# Patient Record
Sex: Female | Born: 1945
Health system: Southern US, Community
[De-identification: ages and names within clinical notes are randomized; demographics above are authoritative.]

## PROBLEM LIST (undated history)

## (undated) DIAGNOSIS — R011 Cardiac murmur, unspecified: Secondary | ICD-10-CM

## (undated) DIAGNOSIS — M1990 Unspecified osteoarthritis, unspecified site: Secondary | ICD-10-CM

## (undated) DIAGNOSIS — D171 Benign lipomatous neoplasm of skin and subcutaneous tissue of trunk: Secondary | ICD-10-CM

## (undated) DIAGNOSIS — E785 Hyperlipidemia, unspecified: Secondary | ICD-10-CM

## (undated) DIAGNOSIS — F419 Anxiety disorder, unspecified: Secondary | ICD-10-CM

## (undated) DIAGNOSIS — I10 Essential (primary) hypertension: Secondary | ICD-10-CM

## (undated) DIAGNOSIS — J302 Other seasonal allergic rhinitis: Secondary | ICD-10-CM

## (undated) DIAGNOSIS — D649 Anemia, unspecified: Secondary | ICD-10-CM

## (undated) DIAGNOSIS — F32A Depression, unspecified: Secondary | ICD-10-CM

## (undated) DIAGNOSIS — E1159 Type 2 diabetes mellitus with other circulatory complications: Secondary | ICD-10-CM

## (undated) DIAGNOSIS — F329 Major depressive disorder, single episode, unspecified: Secondary | ICD-10-CM

## (undated) DIAGNOSIS — Z531 Procedure and treatment not carried out because of patient's decision for reasons of belief and group pressure: Secondary | ICD-10-CM

## (undated) DIAGNOSIS — K635 Polyp of colon: Secondary | ICD-10-CM

## (undated) DIAGNOSIS — E559 Vitamin D deficiency, unspecified: Secondary | ICD-10-CM

## (undated) DIAGNOSIS — E1165 Type 2 diabetes mellitus with hyperglycemia: Principal | ICD-10-CM

## (undated) DIAGNOSIS — E1169 Type 2 diabetes mellitus with other specified complication: Secondary | ICD-10-CM

## (undated) DIAGNOSIS — H9193 Unspecified hearing loss, bilateral: Secondary | ICD-10-CM

## (undated) DIAGNOSIS — H269 Unspecified cataract: Secondary | ICD-10-CM

## (undated) DIAGNOSIS — E119 Type 2 diabetes mellitus without complications: Secondary | ICD-10-CM

## (undated) HISTORY — DX: Unspecified cataract: H26.9

## (undated) HISTORY — DX: Cardiac murmur, unspecified: R01.1

## (undated) HISTORY — DX: Essential (primary) hypertension: I10

## (undated) HISTORY — DX: Unspecified osteoarthritis, unspecified site: M19.90

## (undated) HISTORY — DX: Type 2 diabetes mellitus without complications: E11.9

## (undated) HISTORY — DX: Vitamin D deficiency, unspecified: E55.9

## (undated) HISTORY — DX: Anxiety disorder, unspecified: F41.9

## (undated) HISTORY — DX: Other seasonal allergic rhinitis: J30.2

## (undated) HISTORY — PX: LIPOMA EXCISION: SHX5283

## (undated) HISTORY — DX: Hyperlipidemia, unspecified: E78.5

## (undated) HISTORY — DX: Polyp of colon: K63.5

## (undated) HISTORY — PX: POLYPECTOMY: SHX149

## (undated) HISTORY — DX: Depression, unspecified: F32.A

## (undated) HISTORY — DX: Benign lipomatous neoplasm of skin and subcutaneous tissue of trunk: D17.1

## (undated) HISTORY — DX: Unspecified hearing loss, bilateral: H91.93

## (undated) HISTORY — DX: Anemia, unspecified: D64.9

## (undated) HISTORY — DX: Type 2 diabetes mellitus with hyperglycemia: E11.65

## (undated) HISTORY — DX: Type 2 diabetes mellitus with other circulatory complications: E11.59

## (undated) HISTORY — DX: Type 2 diabetes mellitus with other specified complication: E11.69

## (undated) HISTORY — PX: BREAST BIOPSY: SHX20

## (undated) HISTORY — DX: Procedure and treatment not carried out because of patient's decision for reasons of belief and group pressure: Z53.1

## (undated) HISTORY — PX: COLONOSCOPY: SHX174

---
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
## 1994-03-08 HISTORY — PX: ABDOMINAL HYSTERECTOMY: SHX81

## 1997-06-28 ENCOUNTER — Other Ambulatory Visit: Admission: RE | Admit: 1997-06-28 | Discharge: 1997-06-28 | Payer: Self-pay | Admitting: *Deleted

## 1998-05-12 ENCOUNTER — Other Ambulatory Visit: Admission: RE | Admit: 1998-05-12 | Discharge: 1998-05-12 | Payer: Self-pay | Admitting: *Deleted

## 1999-09-16 ENCOUNTER — Encounter: Payer: Self-pay | Admitting: *Deleted

## 1999-09-16 ENCOUNTER — Encounter: Admission: RE | Admit: 1999-09-16 | Discharge: 1999-09-16 | Payer: Self-pay | Admitting: *Deleted

## 2000-09-21 ENCOUNTER — Encounter: Payer: Self-pay | Admitting: *Deleted

## 2000-09-21 ENCOUNTER — Encounter: Admission: RE | Admit: 2000-09-21 | Discharge: 2000-09-21 | Payer: Self-pay | Admitting: *Deleted

## 2001-10-10 ENCOUNTER — Encounter: Payer: Self-pay | Admitting: *Deleted

## 2001-10-10 ENCOUNTER — Encounter: Admission: RE | Admit: 2001-10-10 | Discharge: 2001-10-10 | Payer: Self-pay | Admitting: *Deleted

## 2002-10-12 ENCOUNTER — Encounter: Payer: Self-pay | Admitting: Internal Medicine

## 2002-10-12 ENCOUNTER — Encounter: Admission: RE | Admit: 2002-10-12 | Discharge: 2002-10-12 | Payer: Self-pay | Admitting: Internal Medicine

## 2003-10-25 ENCOUNTER — Encounter: Admission: RE | Admit: 2003-10-25 | Discharge: 2003-10-25 | Payer: Self-pay | Admitting: Obstetrics and Gynecology

## 2006-03-12 ENCOUNTER — Emergency Department (HOSPITAL_COMMUNITY): Admission: AD | Admit: 2006-03-12 | Discharge: 2006-03-12 | Payer: Self-pay | Admitting: Emergency Medicine

## 2006-04-15 ENCOUNTER — Emergency Department (HOSPITAL_COMMUNITY): Admission: EM | Admit: 2006-04-15 | Discharge: 2006-04-15 | Payer: Self-pay | Admitting: Family Medicine

## 2006-10-26 ENCOUNTER — Encounter: Admission: RE | Admit: 2006-10-26 | Discharge: 2006-10-26 | Payer: Self-pay | Admitting: Internal Medicine

## 2007-01-02 ENCOUNTER — Emergency Department (HOSPITAL_COMMUNITY): Admission: EM | Admit: 2007-01-02 | Discharge: 2007-01-02 | Payer: Self-pay | Admitting: Emergency Medicine

## 2008-01-27 ENCOUNTER — Emergency Department (HOSPITAL_COMMUNITY): Admission: EM | Admit: 2008-01-27 | Discharge: 2008-01-27 | Payer: Self-pay | Admitting: Emergency Medicine

## 2008-02-12 ENCOUNTER — Ambulatory Visit: Payer: Self-pay | Admitting: *Deleted

## 2008-02-13 ENCOUNTER — Encounter: Admission: RE | Admit: 2008-02-13 | Discharge: 2008-02-13 | Payer: Self-pay | Admitting: *Deleted

## 2008-02-13 ENCOUNTER — Ambulatory Visit: Payer: Self-pay | Admitting: *Deleted

## 2008-02-14 LAB — CONVERTED CEMR LAB
BUN: 11 mg/dL (ref 6–23)
Calcium: 8.8 mg/dL (ref 8.4–10.5)
Cholesterol: 229 mg/dL (ref 0–200)
Eosinophils Absolute: 0.2 10*3/uL (ref 0.0–0.7)
Eosinophils Relative: 3 % (ref 0.0–5.0)
GFR calc Af Amer: 109 mL/min
GFR calc non Af Amer: 90 mL/min
Glucose, Bld: 122 mg/dL — ABNORMAL HIGH (ref 70–99)
HCT: 35.6 % — ABNORMAL LOW (ref 36.0–46.0)
Hemoglobin: 12.1 g/dL (ref 12.0–15.0)
MCV: 85.9 fL (ref 78.0–100.0)
Monocytes Absolute: 0.7 10*3/uL (ref 0.1–1.0)
Monocytes Relative: 12.1 % — ABNORMAL HIGH (ref 3.0–12.0)
Neutro Abs: 3.2 10*3/uL (ref 1.4–7.7)
Platelets: 168 10*3/uL (ref 150–400)
Potassium: 3.9 meq/L (ref 3.5–5.1)
RDW: 13.1 % (ref 11.5–14.6)
Sodium: 141 meq/L (ref 135–145)
Total CHOL/HDL Ratio: 4.9
Triglycerides: 99 mg/dL (ref 0–149)

## 2009-01-18 ENCOUNTER — Emergency Department (HOSPITAL_COMMUNITY): Admission: EM | Admit: 2009-01-18 | Discharge: 2009-01-18 | Payer: Self-pay | Admitting: Family Medicine

## 2009-06-17 ENCOUNTER — Encounter (INDEPENDENT_AMBULATORY_CARE_PROVIDER_SITE_OTHER): Payer: Self-pay | Admitting: *Deleted

## 2009-07-16 ENCOUNTER — Telehealth: Payer: Self-pay | Admitting: Internal Medicine

## 2009-08-22 ENCOUNTER — Encounter: Admission: RE | Admit: 2009-08-22 | Discharge: 2009-08-22 | Payer: Self-pay | Admitting: Internal Medicine

## 2009-08-22 LAB — HM MAMMOGRAPHY

## 2009-08-27 ENCOUNTER — Ambulatory Visit: Payer: Self-pay | Admitting: Internal Medicine

## 2009-08-27 DIAGNOSIS — IMO0001 Reserved for inherently not codable concepts without codable children: Secondary | ICD-10-CM

## 2009-09-01 LAB — CONVERTED CEMR LAB
ALT: 13 units/L (ref 0–35)
AST: 20 units/L (ref 0–37)
Basophils Relative: 0.6 % (ref 0.0–3.0)
Chloride: 107 meq/L (ref 96–112)
Direct LDL: 162.9 mg/dL
Eosinophils Relative: 1.7 % (ref 0.0–5.0)
GFR calc non Af Amer: 68.76 mL/min (ref >60)
HCT: 36.3 % (ref 36.0–46.0)
HDL: 55.8 mg/dL (ref >39.00)
Hemoglobin: 12.3 g/dL (ref 12.0–15.0)
Lymphs Abs: 1.7 10*3/uL (ref 0.7–4.0)
MCV: 87.2 fL (ref 78.0–100.0)
Monocytes Absolute: 0.7 10*3/uL (ref 0.1–1.0)
Monocytes Relative: 10.5 % (ref 3.0–12.0)
Neutro Abs: 3.8 10*3/uL (ref 1.4–7.7)
Platelets: 179 10*3/uL (ref 150.0–400.0)
Potassium: 4.4 meq/L (ref 3.5–5.1)
RBC: 4.17 M/uL (ref 3.87–5.11)
Sodium: 142 meq/L (ref 135–145)
Total Bilirubin: 0.5 mg/dL (ref 0.3–1.2)
Total Protein: 7.6 g/dL (ref 6.0–8.3)
VLDL: 21.6 mg/dL (ref 0.0–40.0)
WBC: 6.3 10*3/uL (ref 4.5–10.5)

## 2009-09-12 ENCOUNTER — Encounter: Payer: Self-pay | Admitting: Internal Medicine

## 2009-12-08 ENCOUNTER — Ambulatory Visit: Payer: Self-pay | Admitting: Internal Medicine

## 2009-12-08 LAB — CONVERTED CEMR LAB
AST: 25 units/L (ref 0–37)
Alkaline Phosphatase: 75 units/L (ref 39–117)
Bilirubin, Direct: 0.1 mg/dL (ref 0.0–0.3)
Total Protein: 7.2 g/dL (ref 6.0–8.3)
VLDL: 22.6 mg/dL (ref 0.0–40.0)

## 2009-12-15 ENCOUNTER — Ambulatory Visit: Payer: Self-pay | Admitting: Internal Medicine

## 2010-01-26 ENCOUNTER — Emergency Department (HOSPITAL_COMMUNITY): Admission: EM | Admit: 2010-01-26 | Discharge: 2010-01-26 | Payer: Self-pay | Admitting: Family Medicine

## 2010-04-09 NOTE — Assessment & Plan Note (Signed)
Summary: FU LABS/SP/pt rescd//ccm   Vital Signs:  Patient profile:   65 year old female Menstrual status:  hysterectomy Weight:      239 pounds Temp:     98.6 degrees F oral BP sitting:   140 / 92  (left arm) Cuff size:   large  Vitals Entered By: Sid Falcon LPN (01/14/10 9:34 AM)  Vitals Entered By: Sid Falcon LPN (01-14-10 9:34 AM)  Primary Care Provider:  Birdie Sons MD   History of Present Illness:  Follow-Up Visit      This is a 65 year old woman who presents for Follow-up visit.  The patient denies chest pain and palpitations.  Since the last visit the patient notes no new problems or concerns.  The patient reports taking meds as prescribed.  When questioned about possible medication side effects, the patient notes none.    All other systems reviewed and were negative   Contraindications/Deferment of Procedures/Staging:    Test/Procedure: FLU VAX    Reason for deferment: patient declined     Test/Procedure: TD vaccine    Reason for deferment: declined   Current Problems (verified): 1)  Obesity  (ICD-278.00) 2)  Glucose Intolerance  (ICD-271.3) 3)  Well Adult  (ICD-V70.0) 4)  Lipoma  (ICD-214.9) 5)  Hyperlipidemia  (ICD-272.4) 6)  Hypertension  (ICD-401.9) 7)  Anemia-nos  (ICD-285.9)  Current Medications (verified): 1)  None  Allergies (verified): No Known Drug Allergies  Past History:  Past Medical History: Last updated: 02/12/2008 Anemia-NOS heart murmur - since childhood - worked up - benign per patient Hypertension Hyperlipidemia lipoma  Past Surgical History: Last updated: 02/12/2008 hysterectomy 1996  Family History: Last updated: 01-14-2010 mother deceased 34 mother DM-- mother MI at age 71 mother high cholesterol father--alcohol father deceased CHF 80 yo  Social History: Last updated: 02/12/2008 Occupation: Public librarian married no children by birth, but 5 step children  Risk Factors: Caffeine Use:  2 (02/12/2008) Exercise: yes (02/12/2008)  Risk Factors: Smoking Status: quit (08/27/2009) Passive Smoke Exposure: no (02/12/2008)  Family History: mother deceased 13 mother DM-- mother MI at age 68 mother high cholesterol father--alcohol father deceased CHF 57 yo  Physical Exam  General:  alert and well-developed.   Head:  normocephalic and atraumatic.   Eyes:  pupils equal and pupils round.   Ears:  R ear normal and L ear normal.   Neck:  No deformities, masses, or tenderness noted. Lungs:  normal respiratory effort and no intercostal retractions.   Heart:  normal rate and regular rhythm.   Abdomen:  soft and non-tender.   Skin:  turgor normal and color normal.     Impression & Recommendations:  Problem # 1:  HYPERLIPIDEMIA (ICD-272.4)  note HDL will not reat no treatment necessary Labs Reviewed: SGOT: 25 (12/08/2009)   SGPT: 16 (12/08/2009)   HDL:57.50 (12/08/2009), 55.80 (08/27/2009)  LDL:DEL (02/13/2008)  Chol:239 (12/08/2009), 226 (08/27/2009)  Trig:113.0 (12/08/2009), 108.0 (08/27/2009)  Problem # 2:  HYPERTENSION (ICD-401.9) not controlled but not high enough to treat BP today: 140/92 Prior BP: 124/78 (08/27/2009)  Labs Reviewed: K+: 4.4 (08/27/2009) Creat: : 1.0 (08/27/2009)   Chol: 239 (12/08/2009)   HDL: 57.50 (12/08/2009)   LDL: DEL (02/13/2008)   TG: 113.0 (12/08/2009)  Problem # 3:  ANEMIA-NOS (ICD-285.9) resolved no further eval necessary Hgb: 12.3 (08/27/2009)   Hct: 36.3 (08/27/2009)   Platelets: 179.0 (08/27/2009) RBC: 4.17 (08/27/2009)   RDW: 14.4 (08/27/2009)   WBC: 6.3 (08/27/2009) MCV: 87.2 (08/27/2009)  MCHC: 33.8 (08/27/2009) TSH: 1.05 (08/27/2009)  Patient Instructions: 1)  Please schedule a follow-up appointment in 6 months. 2)  lipids 272.4 3)  liver 995.2  Prevention & Chronic Care Immunizations   Influenza vaccine: Not documented   Influenza vaccine deferral: patient declined  (12/15/2009)    Tetanus booster: Not  documented   Td booster deferral: declined  (12/15/2009)    Pneumococcal vaccine: Not documented    H. zoster vaccine: Not documented  Colorectal Screening   Hemoccult: Not documented    Colonoscopy: Normal  (09/04/2004)   Colonoscopy due: 09/2011  Other Screening   Pap smear: Normal  (09/04/2004)   Pap smear action/deferral: not indicated  (08/27/2009)    Mammogram: ASSESSMENT: Negative - BI-RADS 1^MM DIGITAL SCREENING  (08/22/2009)   Mammogram due: 09/2011    DXA bone density scan: Not documented   Smoking status: quit  (08/27/2009)  Lipids   Total Cholesterol: 239  (12/08/2009)   LDL: DEL  (02/13/2008)   LDL Direct: 174.7  (12/08/2009)   HDL: 57.50  (12/08/2009)   Triglycerides: 113.0  (12/08/2009)    SGOT (AST): 25  (12/08/2009)   SGPT (ALT): 16  (12/08/2009)   Alkaline phosphatase: 75  (12/08/2009)   Total bilirubin: 0.5  (12/08/2009)  Hypertension   Last Blood Pressure: 140 / 92  (12/15/2009)   Serum creatinine: 1.0  (08/27/2009)   Serum potassium 4.4  (08/27/2009)  Self-Management Support :    Hypertension self-management support: Not documented    Lipid self-management support: Not documented

## 2010-04-09 NOTE — Letter (Signed)
Summary: Colonoscopy Date Change Letter  [facility]  [address] [place], [place] [address]   Phone: [phone]  Fax: [phone]      June 17, 2009 MRN: [id number]   [facility] [place] [address] [place], [place]  [address]   Dear Ms. Sura,   Previously you were recommended to have a repeat colonoscopy around this time. Your chart was recently reviewed by Dr. Claudette Head of [facility]. Follow up colonoscopy is now recommended in April 2014. This revised recommendation is based on current, nationally recognized guidelines for colorectal cancer screening and polyp surveillance. These guidelines are endorsed by the American Cancer Society, [employer] on Colorectal Cancer as well as numerous other major medical organizations.  Please understand that our recommendation assumes that you do not have any new symptoms such as bleeding, a change in bowel habits, anemia, or significant abdominal discomfort. If you do have any concerning GI symptoms or want to discuss the guideline recommendations, please call to arrange an office visit at your earliest convenience. Otherwise we will keep you in our reminder system and contact you 1-2 months prior to the date listed above to schedule your next colonoscopy.  Thank you,  Judie Petit T. Russella Dar, M.D.  [facility] [phone]

## 2010-04-09 NOTE — Assessment & Plan Note (Signed)
Summary: PT RE-EST // RS pt rsc/njr rsc bmp/njr   Vital Signs:  Patient profile:   65 year old female Menstrual status:  hysterectomy Height:      68 inches Weight:      237 pounds BMI:     36.17 Pulse rate:   72 / minute Pulse rhythm:   regular Resp:     14 per minute BP sitting:   124 / 78  (left arm) Cuff size:   regular  Vitals Entered By: Gladis Riffle, RN (August 27, 2009 8:21 AM) CC: to re-establish--c/o fatty lump at upper left back (has been removed and reappeared) Is Patient Diabetic? No LMP - Character: hysterectomy Menarche (age onset years): 15   Menses interval (days): 28 Menstrual flow (days): 4 Menstrual Status hysterectomy Last PAP Result Normal   Primary Care Provider:  Paulo Fruit MD  CC:  to re-establish--c/o fatty lump at upper left back (has been removed and reappeared).  History of Present Illness: new patient (she states I have seen her 10 years ago)  Here to reestablish (has health insurance) she has a hx of glucose intolerance---not checked in years. she has no overt sxs of DM  she has a lipoma---removed 15 years ago. has recurred---she is concerned with growing.   HTN---no medications  All other systems reviewed and were negative   Preventive Screening-Counseling & Management  Alcohol-Tobacco     Smoking Status: quit     Year Started: 1971     Year Quit: 1976  Current Problems (verified): 1)  Glucose Intolerance  (ICD-271.3) 2)  Well Adult  (ICD-V70.0) 3)  Lipoma  (ICD-214.9) 4)  Hyperlipidemia  (ICD-272.4) 5)  Hypertension  (ICD-401.9) 6)  Anemia-nos  (ICD-285.9)  Current Medications (verified): 1)  None  Allergies (verified): No Known Drug Allergies  Contraindications/Deferment of Procedures/Staging:    Test/Procedure: PAP Smear    Reason for deferment: not indicated   Family History: Family History of Alcoholism/Addiction--father Family History Diabetes 1st degree relative---father and mother Family History High  cholesterol-father and mother Family History Kidney disease--mother  Physical Exam  General:  alert and well-developed.   Head:  normocephalic and atraumatic.   Eyes:  pupils equal and pupils round.   Ears:  R ear normal and L ear normal.   Neck:  No deformities, masses, or tenderness noted. Lungs:  normal respiratory effort and no intercostal retractions.   Heart:  normal rate and regular rhythm.   Abdomen:  Bowel sounds positive,abdomen soft and non-tender without masses, organomegaly or hernias noted. Msk:  No deformity or scoliosis noted of thoracic or lumbar spine.   Neurologic:  cranial nerves II-XII intact and gait normal.     Impression & Recommendations:  Problem # 1:  GLUCOSE INTOLERANCE (ICD-271.3) likely related to weight advised aggressive weight loss  Problem # 2:  HYPERTENSION (ICD-401.9)  controlled and on no meds BP today: 124/78 Prior BP: 140/82 (02/12/2008)  Labs Reviewed: K+: 3.9 (02/13/2008) Creat: : 0.7 (02/13/2008)   Chol: 229 (02/13/2008)   HDL: 46.9 (02/13/2008)   LDL: DEL (02/13/2008)   TG: 99 (02/13/2008)  Orders: TLB-BMP (Basic Metabolic Panel-BMET) (80048-METABOL)  Problem # 3:  ANEMIA-NOS (ICD-285.9)  recheck today Hgb: 12.1 (02/13/2008)   Hct: 35.6 (02/13/2008)   Platelets: 168 (02/13/2008) RBC: 4.14 (02/13/2008)   RDW: 13.1 (02/13/2008)   WBC: 5.7 (02/13/2008) MCV: 85.9 (02/13/2008)   MCHC: 34.1 (02/13/2008) TSH: 1.29 (02/13/2008)  Orders: TLB-CBC Platelet - w/Differential (85025-CBCD)  Problem # 4:  OBESITY (ICD-278.00)  diet ,  exercise  Orders: TLB-TSH (Thyroid Stimulating Hormone) (84443-TSH)  Problem # 5:  LIPOMA (ICD-214.9)  i have advised no treatment will refer to surgeon for opinion  Orders: Surgical Referral (Surgery)  Other Orders: TLB-Lipid Panel (80061-LIPID) TLB-Hepatic/Liver Function Pnl (80076-HEPATIC)   Preventive Care Screening  Mammogram:    Next Due:  09/2011  Colonoscopy:    Next Due:   09/2011

## 2010-04-09 NOTE — Progress Notes (Signed)
Summary: REQ FOR APPT.  Phone Note Call from Patient   Caller: Patient   [phone] Reason for Call: Talk to Nurse, Talk to Doctor Summary of Call: Pt called to schedule an appt with Dr Cato Mulligan..... Unknown when pt was last seen / last appt - notation in EMR states Dr Cato Mulligan signed off on a Mammogram report for this pt on 11/04/2006 but pt was seen by Dr Johann Capers after that at [facility] (Dr Andrey Campanile has since moved)..... Pt wants to come to [facility] to become a pt of Dr Cato Mulligan again.... Can you advise?    Please let me know and I will contact pt at [phone] to schedule an appt..... Thanks!  Initial call taken by: Debbra Riding,  Jul 16, 2009 3:29 PM  Follow-up for Phone Call        would be new patient i'll see or can see drk/burchette Follow-up by: Birdie Sons MD,  Jul 17, 2009 4:53 PM  Additional Follow-up for Phone Call Additional follow up Details #1::        Phone Call Completed-----Contacted pt and she adv that since she used to see Dr Cato Mulligan in the past, she would like to schedule a pt re-est / new pt appt with Dr Cato Mulligan.... Pt made appt for Thursday, Jul 31, 2009 at 9am... Pt will come in fasting in case Dr Cato Mulligan wants to order any labwork.  Additional Follow-up by: Debbra Riding,  Jul 18, 2009 8:38 AM

## 2010-04-09 NOTE — Letter (Signed)
Summary: [facility]   Imported By: Maryln Gottron 10/22/2009 13:23:22  _____________________________________________________________________  External Attachment:    Type:   Image     Comment:   External Document

## 2010-06-08 ENCOUNTER — Other Ambulatory Visit (INDEPENDENT_AMBULATORY_CARE_PROVIDER_SITE_OTHER): Payer: Self-pay | Admitting: Internal Medicine

## 2010-06-08 DIAGNOSIS — T887XXA Unspecified adverse effect of drug or medicament, initial encounter: Secondary | ICD-10-CM

## 2010-06-08 DIAGNOSIS — E785 Hyperlipidemia, unspecified: Secondary | ICD-10-CM

## 2010-06-08 LAB — LIPID PANEL
HDL: 55.3 mg/dL (ref >39.00)
Total CHOL/HDL Ratio: 4
Triglycerides: 88 mg/dL (ref 0.0–149.0)

## 2010-06-08 LAB — LDL CHOLESTEROL, DIRECT: Direct LDL: 175.7 mg/dL

## 2010-06-08 LAB — HEPATIC FUNCTION PANEL
AST: 22 U/L (ref 0–37)
Bilirubin, Direct: 0.1 mg/dL (ref 0.0–0.3)
Total Bilirubin: 0.3 mg/dL (ref 0.3–1.2)

## 2010-06-11 ENCOUNTER — Encounter: Payer: Self-pay | Admitting: Internal Medicine

## 2010-06-15 ENCOUNTER — Ambulatory Visit: Payer: Self-pay | Admitting: Internal Medicine

## 2010-06-15 ENCOUNTER — Encounter: Payer: Self-pay | Admitting: Internal Medicine

## 2010-06-15 ENCOUNTER — Ambulatory Visit (INDEPENDENT_AMBULATORY_CARE_PROVIDER_SITE_OTHER): Payer: BC Managed Care – PPO | Admitting: Internal Medicine

## 2010-06-15 DIAGNOSIS — R7309 Other abnormal glucose: Secondary | ICD-10-CM

## 2010-06-15 DIAGNOSIS — E739 Lactose intolerance, unspecified: Secondary | ICD-10-CM

## 2010-06-15 DIAGNOSIS — E669 Obesity, unspecified: Secondary | ICD-10-CM

## 2010-06-15 DIAGNOSIS — I10 Essential (primary) hypertension: Secondary | ICD-10-CM

## 2010-06-15 DIAGNOSIS — E785 Hyperlipidemia, unspecified: Secondary | ICD-10-CM

## 2010-06-15 DIAGNOSIS — R739 Hyperglycemia, unspecified: Secondary | ICD-10-CM

## 2010-06-15 NOTE — Assessment & Plan Note (Signed)
Probably needs treatment Check labs today

## 2010-06-15 NOTE — Assessment & Plan Note (Signed)
Never treated She needs to take a medication---she refuses She will try diet and exercise See me 3 months

## 2010-06-15 NOTE — Progress Notes (Signed)
  Subjective:    Patient ID: Shelly Yoder, female    DOB: 29-Aug-1945, 65 y.o.   MRN: [id number]  HPI  Htn: no home BPs  Lipids---needs f/u  Glucose: needs f/u  Past Medical History  Diagnosis Date  . Anemia   . Heart murmur   . Hyperlipidemia   . Hypertension   . Lipoma    Past Surgical History  Procedure Date  . Abdominal hysterectomy 1996    reports that she quit smoking about 35 years ago. She does not have any smokeless tobacco history on file. Her alcohol and drug histories not on file. family history includes Alcohol abuse in her father; Diabetes in her mother; Heart attack in her mother; and Hyperlipidemia in her mother. No Known Allergies   Review of Systems  patient denies chest pain, shortness of breath, orthopnea. Denies lower extremity edema, abdominal pain, change in appetite, change in bowel movements. Patient denies rashes, musculoskeletal complaints. No other specific complaints in a complete review of systems.      Objective:   Physical Exam  Well-developed well-nourished female in no acute distress. HEENT exam atraumatic, normocephalic, extraocular muscles are intact. Neck is supple. No jugular venous distention no thyromegaly. Chest clear to auscultation without increased work of breathing. Cardiac exam S1 and S2 are regular. Abdominal exam active bowel sounds, soft, nontender. Extremities no edema. Neurologic exam she is alert without any motor sensory deficits. Gait is normal.        Assessment & Plan:

## 2010-06-15 NOTE — Assessment & Plan Note (Signed)
Note weight She needs f/u labs Check today

## 2010-06-15 NOTE — Assessment & Plan Note (Signed)
She needs to lose weight Discussed need for diet and exercise

## 2010-09-14 ENCOUNTER — Other Ambulatory Visit: Payer: BC Managed Care – PPO

## 2010-09-21 ENCOUNTER — Ambulatory Visit: Payer: BC Managed Care – PPO | Admitting: Internal Medicine

## 2012-03-08 DIAGNOSIS — E119 Type 2 diabetes mellitus without complications: Secondary | ICD-10-CM

## 2012-03-08 HISTORY — DX: Type 2 diabetes mellitus without complications: E11.9

## 2012-06-14 ENCOUNTER — Encounter: Payer: Self-pay | Admitting: Gastroenterology

## 2012-11-22 ENCOUNTER — Other Ambulatory Visit: Payer: Self-pay

## 2012-11-22 DIAGNOSIS — Z1231 Encounter for screening mammogram for malignant neoplasm of breast: Secondary | ICD-10-CM

## 2012-12-18 ENCOUNTER — Ambulatory Visit
Admission: RE | Admit: 2012-12-18 | Discharge: 2012-12-18 | Disposition: A | Discharge status: Home / Self Care | Payer: Medicare Other | Source: Ambulatory Visit

## 2012-12-18 DIAGNOSIS — Z1231 Encounter for screening mammogram for malignant neoplasm of breast: Secondary | ICD-10-CM

## 2013-02-23 ENCOUNTER — Other Ambulatory Visit (INDEPENDENT_AMBULATORY_CARE_PROVIDER_SITE_OTHER): Payer: Medicare Other

## 2013-02-23 DIAGNOSIS — E785 Hyperlipidemia, unspecified: Secondary | ICD-10-CM

## 2013-02-23 DIAGNOSIS — I10 Essential (primary) hypertension: Secondary | ICD-10-CM

## 2013-02-23 DIAGNOSIS — Z0000 Encounter for general adult medical examination without abnormal findings: Secondary | ICD-10-CM

## 2013-02-23 LAB — CBC WITH DIFFERENTIAL/PLATELET
Basophils Relative: 0.7 % (ref 0.0–3.0)
Eosinophils Relative: 2.2 % (ref 0.0–5.0)
HCT: 36 % (ref 36.0–46.0)
Hemoglobin: 12 g/dL (ref 12.0–15.0)
Lymphs Abs: 1.7 10*3/uL (ref 0.7–4.0)
Monocytes Relative: 9.9 % (ref 3.0–12.0)
Neutro Abs: 3.4 10*3/uL (ref 1.4–7.7)
WBC: 5.9 10*3/uL (ref 4.5–10.5)

## 2013-02-23 LAB — POCT URINALYSIS DIPSTICK
Blood, UA: NEGATIVE
Glucose, UA: NEGATIVE
Nitrite, UA: NEGATIVE
Urobilinogen, UA: 0.2

## 2013-02-23 LAB — LIPID PANEL
Cholesterol: 263 mg/dL — ABNORMAL HIGH (ref 0–200)
Total CHOL/HDL Ratio: 5
VLDL: 21.8 mg/dL (ref 0.0–40.0)

## 2013-02-23 LAB — BASIC METABOLIC PANEL
GFR: 119.1 mL/min (ref >60.00)
Potassium: 3.9 mEq/L (ref 3.5–5.1)
Sodium: 140 mEq/L (ref 135–145)

## 2013-02-23 LAB — LDL CHOLESTEROL, DIRECT: Direct LDL: 199.2 mg/dL

## 2013-02-23 LAB — HEPATIC FUNCTION PANEL
ALT: 20 U/L (ref 0–35)
Total Bilirubin: 0.5 mg/dL (ref 0.3–1.2)

## 2013-03-02 ENCOUNTER — Ambulatory Visit (INDEPENDENT_AMBULATORY_CARE_PROVIDER_SITE_OTHER): Payer: Medicare Other | Admitting: Internal Medicine

## 2013-03-02 ENCOUNTER — Encounter: Payer: Self-pay | Admitting: Internal Medicine

## 2013-03-02 VITALS — BP 144/80 | HR 84 | Temp 98.0°F | Ht 68.75 in | Wt 239.0 lb

## 2013-03-02 DIAGNOSIS — E669 Obesity, unspecified: Secondary | ICD-10-CM

## 2013-03-02 DIAGNOSIS — R739 Hyperglycemia, unspecified: Secondary | ICD-10-CM

## 2013-03-02 DIAGNOSIS — Z0000 Encounter for general adult medical examination without abnormal findings: Secondary | ICD-10-CM

## 2013-03-02 DIAGNOSIS — R7309 Other abnormal glucose: Secondary | ICD-10-CM

## 2013-03-02 DIAGNOSIS — E785 Hyperlipidemia, unspecified: Secondary | ICD-10-CM

## 2013-03-02 DIAGNOSIS — I10 Essential (primary) hypertension: Secondary | ICD-10-CM

## 2013-03-02 NOTE — Progress Notes (Signed)
Pre visit review using our clinic review tool, if applicable. No additional management support is needed unless otherwise documented below in the visit note. 

## 2013-03-02 NOTE — Progress Notes (Signed)
cpx  Past Medical History  Diagnosis Date  . Anemia   . Heart murmur   . Hyperlipidemia   . Hypertension   . Lipoma     History   Social History  . Marital Status: Married    Spouse Name: N/A    Number of Children: N/A  . Years of Education: N/A   Occupational History  . Not on file.   Social History Main Topics  . Smoking status: Former Smoker    Quit date: 06/15/1975  . Smokeless tobacco: Not on file  . Alcohol Use:   . Drug Use:   . Sexual Activity:    Other Topics Concern  . Not on file   Social History Narrative  . No narrative on file    Past Surgical History  Procedure Laterality Date  . Abdominal hysterectomy  1996    Family History  Problem Relation Age of Onset  . Diabetes Mother   . Heart attack Mother   . Hyperlipidemia Mother   . Alcohol abuse Father     No Known Allergies  No current outpatient prescriptions on file prior to visit.   No current facility-administered medications on file prior to visit.     patient denies chest pain, shortness of breath, orthopnea. Denies lower extremity edema, abdominal pain, change in appetite, change in bowel movements. Patient denies rashes, musculoskeletal complaints. No other specific complaints in a complete review of systems.   BP 144/80  Pulse 84  Temp(Src) 98 F (36.7 C) (Oral)  Ht 5' 8.75" (1.746 m)  Wt 239 lb (108.41 kg)  BMI 35.56 kg/m2  Well-developed well-nourished female in no acute distress. HEENT exam atraumatic, normocephalic, extraocular muscles are intact. Neck is supple. No jugular venous distention no thyromegaly. Chest clear to auscultation without increased work of breathing. Cardiac exam S1 and S2 are regular. Abdominal exam active bowel sounds, soft, nontender. Extremities no edema. Neurologic exam she is alert without any motor sensory deficits. Gait is normal.  Well visit- health miant utd  Note lipids She needs to concentrate on weight loss.

## 2013-04-30 ENCOUNTER — Ambulatory Visit: Payer: Medicare Other | Admitting: Dietician

## 2013-05-28 ENCOUNTER — Other Ambulatory Visit: Payer: Medicare Other

## 2013-11-30 ENCOUNTER — Other Ambulatory Visit: Payer: Self-pay

## 2013-11-30 DIAGNOSIS — Z1231 Encounter for screening mammogram for malignant neoplasm of breast: Secondary | ICD-10-CM

## 2013-12-24 ENCOUNTER — Ambulatory Visit
Admission: RE | Admit: 2013-12-24 | Discharge: 2013-12-24 | Disposition: A | Discharge status: Home / Self Care | Payer: Medicare Other | Source: Ambulatory Visit

## 2013-12-24 DIAGNOSIS — Z1231 Encounter for screening mammogram for malignant neoplasm of breast: Secondary | ICD-10-CM

## 2013-12-31 ENCOUNTER — Ambulatory Visit (INDEPENDENT_AMBULATORY_CARE_PROVIDER_SITE_OTHER): Payer: Medicare Other | Admitting: Family

## 2013-12-31 ENCOUNTER — Encounter: Payer: Self-pay | Admitting: Family

## 2013-12-31 VITALS — BP 140/80 | HR 80 | Ht 68.75 in | Wt 249.7 lb

## 2013-12-31 DIAGNOSIS — I10 Essential (primary) hypertension: Secondary | ICD-10-CM

## 2013-12-31 DIAGNOSIS — R739 Hyperglycemia, unspecified: Secondary | ICD-10-CM

## 2013-12-31 DIAGNOSIS — E785 Hyperlipidemia, unspecified: Secondary | ICD-10-CM

## 2013-12-31 NOTE — Progress Notes (Signed)
Pre visit review using our clinic review tool, if applicable. No additional management support is needed unless otherwise documented below in the visit note. 

## 2013-12-31 NOTE — Patient Instructions (Signed)
Cardiac Diet This diet can help prevent heart disease and stroke. Many factors influence your heart health, including eating and exercise habits. Coronary risk rises a lot with abnormal blood fat (lipid) levels. Cardiac meal planning includes limiting unhealthy fats, increasing healthy fats, and making other small dietary changes. General guidelines are as follows:  Adjust calorie intake to reach and maintain desirable body weight.  Limit total fat intake to less than 30% of total calories. Saturated fat should be less than 7% of calories.  Saturated fats are found in animal products and in some vegetable products. Saturated vegetable fats are found in coconut oil, cocoa butter, palm oil, and palm kernel oil. Read labels carefully to avoid these products as much as possible. Use butter in moderation. Choose tub margarines and oils that have 2 grams of fat or less. Good cooking oils are canola and olive oils.  Practice low-fat cooking techniques. Do not fry food. Instead, broil, bake, boil, steam, grill, roast on a rack, stir-fry, or microwave it. Other fat reducing suggestions include:  Remove the skin from poultry.  Remove all visible fat from meats.  Skim the fat off stews, soups, and gravies before serving them.  Steam vegetables in water or broth instead of sauting them in fat.  Avoid foods with trans fat (or hydrogenated oils), such as commercially fried foods and commercially baked goods. Commercial shortening and deep-frying fats will contain trans fat.  Increase intake of fruits, vegetables, whole grains, and legumes to replace foods high in fat.  Increase consumption of nuts, legumes, and seeds to at least 4 servings weekly. One serving of a legume equals  cup, and 1 serving of nuts or seeds equals  cup.  Choose whole grains more often. Have 3 servings per day (a serving is 1 ounce [oz]).  Eat 4 to 5 servings of vegetables per day. A serving of vegetables is 1 cup of raw leafy  vegetables;  cup of raw or cooked cut-up vegetables;  cup of vegetable juice.  Eat 4 to 5 servings of fruit per day. A serving of fruit is 1 medium whole fruit;  cup of dried fruit;  cup of fresh, frozen, or canned fruit;  cup of 100% fruit juice.  Increase your intake of dietary fiber to 20 to 30 grams per day. Insoluble fiber may help lower your risk of heart disease and may help curb your appetite.  Soluble fiber binds cholesterol to be removed from the blood. Foods high in soluble fiber are dried beans, citrus fruits, oats, apples, bananas, broccoli, Brussels sprouts, and eggplant.  Try to include foods fortified with plant sterols or stanols, such as yogurt, breads, juices, or margarines. Choose several fortified foods to achieve a daily intake of 2 to 3 grams of plant sterols or stanols.  Foods with omega-3 fats can help reduce your risk of heart disease. Aim to have a 3.5 oz portion of fatty fish twice per week, such as salmon, mackerel, albacore tuna, sardines, lake trout, or herring. If you wish to take a fish oil supplement, choose one that contains 1 gram of both DHA and EPA.  Limit processed meats to 2 servings (3 oz portion) weekly.  Limit the sodium in your diet to 1500 milligrams (mg) per day. If you have high blood pressure, talk to a registered dietitian about a DASH (Dietary Approaches to Stop Hypertension) eating plan.  Limit sweets and beverages with added sugar, such as soda, to no more than 5 servings per week. One   serving is:   1 tablespoon sugar.  1 tablespoon jelly or jam.   cup sorbet.  1 cup lemonade.   cup regular soda. CHOOSING FOODS Starches  Allowed: Breads: All kinds (wheat, rye, raisin, white, oatmeal, Italian, French, and English muffin bread). Low-fat rolls: English muffins, frankfurter and hamburger buns, bagels, pita bread, tortillas (not fried). Pancakes, waffles, biscuits, and muffins made with recommended oil.  Avoid: Products made with  saturated or trans fats, oils, or whole milk products. Butter rolls, cheese breads, croissants. Commercial doughnuts, muffins, sweet rolls, biscuits, waffles, pancakes, store-bought mixes. Crackers  Allowed: Low-fat crackers and snacks: Animal, graham, rye, saltine (with recommended oil, no lard), oyster, and matzo crackers. Bread sticks, melba toast, rusks, flatbread, pretzels, and light popcorn.  Avoid: High-fat crackers: cheese crackers, butter crackers, and those made with coconut, palm oil, or trans fat (hydrogenated oils). Buttered popcorn. Cereals  Allowed: Hot or cold whole-grain cereals.  Avoid: Cereals containing coconut, hydrogenated vegetable fat, or animal fat. Potatoes / Pasta / Rice  Allowed: All kinds of potatoes, rice, and pasta (such as macaroni, spaghetti, and noodles).  Avoid: Pasta or rice prepared with cream sauce or high-fat cheese. Chow mein noodles, French fries. Vegetables  Allowed: All vegetables and vegetable juices.  Avoid: Fried vegetables. Vegetables in cream, butter, or high-fat cheese sauces. Limit coconut. Fruit in cream or custard. Protein  Allowed: Limit your intake of meat, seafood, and poultry to no more than 6 oz (cooked weight) per day. All lean, well-trimmed beef, veal, pork, and lamb. All chicken and turkey without skin. All fish and shellfish. Wild game: wild duck, rabbit, pheasant, and venison. Egg whites or low-cholesterol egg substitutes may be used as desired. Meatless dishes: recipes with dried beans, peas, lentils, and tofu (soybean curd). Seeds and nuts: all seeds and most nuts.  Avoid: Prime grade and other heavily marbled and fatty meats, such as short ribs, spare ribs, rib eye roast or steak, frankfurters, sausage, bacon, and high-fat luncheon meats, mutton. Caviar. Commercially fried fish. Domestic duck, goose, venison sausage. Organ meats: liver, gizzard, heart, chitterlings, brains, kidney, sweetbreads. Dairy  Allowed: Low-fat  cheeses: nonfat or low-fat cottage cheese (1% or 2% fat), cheeses made with part skim milk, such as mozzarella, farmers, string, or ricotta. (Cheeses should be labeled no more than 2 to 6 grams fat per oz.). Skim (or 1%) milk: liquid, powdered, or evaporated. Buttermilk made with low-fat milk. Drinks made with skim or low-fat milk or cocoa. Chocolate milk or cocoa made with skim or low-fat (1%) milk. Nonfat or low-fat yogurt.  Avoid: Whole milk cheeses, including colby, cheddar, muenster, Monterey Jack, Havarti, Brie, Camembert, American, Swiss, and blue. Creamed cottage cheese, cream cheese. Whole milk and whole milk products, including buttermilk or yogurt made from whole milk, drinks made from whole milk. Condensed milk, evaporated whole milk, and 2% milk. Soups and Combination Foods  Allowed: Low-fat low-sodium soups: broth, dehydrated soups, homemade broth, soups with the fat removed, homemade cream soups made with skim or low-fat milk. Low-fat spaghetti, lasagna, chili, and Spanish rice if low-fat ingredients and low-fat cooking techniques are used.  Avoid: Cream soups made with whole milk, cream, or high-fat cheese. All other soups. Desserts and Sweets  Allowed: Sherbet, fruit ices, gelatins, meringues, and angel food cake. Homemade desserts with recommended fats, oils, and milk products. Jam, jelly, honey, marmalade, sugars, and syrups. Pure sugar candy, such as gum drops, hard candy, jelly beans, marshmallows, mints, and small amounts of dark chocolate.  Avoid: Commercially prepared   cakes, pies, cookies, frosting, pudding, or mixes for these products. Desserts containing whole milk products, chocolate, coconut, lard, palm oil, or palm kernel oil. Ice cream or ice cream drinks. Candy that contains chocolate, coconut, butter, hydrogenated fat, or unknown ingredients. Buttered syrups. Fats and Oils  Allowed: Vegetable oils: safflower, sunflower, corn, soybean, cottonseed, sesame, canola, olive,  or peanut. Non-hydrogenated margarines. Salad dressing or mayonnaise: homemade or commercial, made with a recommended oil. Low or nonfat salad dressing or mayonnaise.  Limit added fats and oils to 6 to 8 tsp per day (includes fats used in cooking, baking, salads, and spreads on bread). Remember to count the "hidden fats" in foods.  Avoid: Solid fats and shortenings: butter, lard, salt pork, bacon drippings. Gravy containing meat fat, shortening, or suet. Cocoa butter, coconut. Coconut oil, palm oil, palm kernel oil, or hydrogenated oils: these ingredients are often used in bakery products, nondairy creamers, whipped toppings, candy, and commercially fried foods. Read labels carefully. Salad dressings made of unknown oils, sour cream, or cheese, such as blue cheese and Roquefort. Cream, all kinds: half-and-half, light, heavy, or whipping. Sour cream or cream cheese (even if "light" or low-fat). Nondairy cream substitutes: coffee creamers and sour cream substitutes made with palm, palm kernel, hydrogenated oils, or coconut oil. Beverages  Allowed: Coffee (regular or decaffeinated), tea. Diet carbonated beverages, mineral water. Alcohol: Check with your caregiver. Moderation is recommended.  Avoid: Whole milk, regular sodas, and juice drinks with added sugar. Condiments  Allowed: All seasonings and condiments. Cocoa powder. "Cream" sauces made with recommended ingredients.  Avoid: Carob powder made with hydrogenated fats. SAMPLE MENU Breakfast   cup orange juice   cup oatmeal  1 slice toast  1 tsp margarine  1 cup skim milk Lunch  Turkey sandwich with 2 oz turkey, 2 slices bread  Lettuce and tomato slices  Fresh fruit  Carrot sticks  Coffee or tea Snack  Fresh fruit or low-fat crackers Dinner  3 oz lean ground beef  1 baked potato  1 tsp margarine   cup asparagus  Lettuce salad  1 tbs non-creamy dressing   cup peach slices  1 cup skim milk Document Released:  12/02/2007 Document Revised: 08/24/2011 Document Reviewed: 04/24/2013 [employer] Patient Information 2015 [employer]. This information is not intended to replace advice given to you by your health care provider. Make sure you discuss any questions you have with your health care provider.  

## 2013-12-31 NOTE — Progress Notes (Signed)
Subjective:    Patient ID: Shelly Yoder, female    DOB: 29-Dec-1945, 69 y.o.   MRN: [id number]  HPI 68 year old AAF, nonsmoker, is in today to be established. She has a history of obesity, hypertension, hyperlipidemia. Had mammogram done in October. Is due for coloscopy and wishes to schedule on her own. Denies any concerns.    Review of Systems  Constitutional: Negative.   HENT: Negative.   Respiratory: Negative.   Cardiovascular: Negative.   Gastrointestinal: Negative.   Endocrine: Negative.   Genitourinary: Negative.   Musculoskeletal: Negative.   Skin: Negative.   Neurological: Negative.   Hematological: Negative.   Psychiatric/Behavioral: Negative.    Past Medical History  Diagnosis Date  . Anemia   . Heart murmur   . Hyperlipidemia   . Hypertension   . Lipoma     History   Social History  . Marital Status: Married    Spouse Name: N/A    Number of Children: N/A  . Years of Education: N/A   Occupational History  . Not on file.   Social History Main Topics  . Smoking status: Former Smoker    Quit date: 06/15/1975  . Smokeless tobacco: Not on file  . Alcohol Use:   . Drug Use:   . Sexual Activity:    Other Topics Concern  . Not on file   Social History Narrative  . No narrative on file    Past Surgical History  Procedure Laterality Date  . Abdominal hysterectomy  1996    Family History  Problem Relation Age of Onset  . Diabetes Mother   . Heart attack Mother   . Hyperlipidemia Mother   . Heart disease Mother 35    sudden death  . Alcohol abuse Father   . Diabetes Father   . Heart disease Father     chf    No Known Allergies  No current outpatient prescriptions on file prior to visit.   No current facility-administered medications on file prior to visit.    BP 140/80  Pulse 80  Ht 5' 8.75" (1.746 m)  Wt 249 lb 11.2 oz (113.263 kg)  BMI 37.15 kg/m2chart    Objective:   Physical Exam  Constitutional: She is oriented to person,  place, and time. She appears well-developed and well-nourished.  HENT:  Right Ear: External ear normal.  Left Ear: External ear normal.  Nose: Nose normal.  Mouth/Throat: Oropharynx is clear and moist.  Neck: Normal range of motion. Neck supple. No thyromegaly present.  Cardiovascular: Normal rate, regular rhythm and normal heart sounds.   Pulmonary/Chest: Effort normal and breath sounds normal.  Abdominal: Soft. Bowel sounds are normal.  Musculoskeletal: Normal range of motion.  Neurological: She is alert and oriented to person, place, and time. No cranial nerve deficit. Coordination normal.  Skin: Skin is warm and dry.  Psychiatric: She has a normal mood and affect.          Assessment & Plan:  Shelly Yoder was seen today for establish care.  Diagnoses and associated orders for this visit:  Essential hypertension - Lipid Panel; Future - Hemoglobin A1c; Future - Basic Metabolic Panel; Future - Hepatic Function Panel; Future - CBC with Differential; Future  Hyperlipidemia - Lipid Panel; Future - Hemoglobin A1c; Future - Basic Metabolic Panel; Future - Hepatic Function Panel; Future - CBC with Differential; Future  Hyperglycemia - Lipid Panel; Future - Hemoglobin A1c; Future - Basic Metabolic Panel; Future - Hepatic Function Panel; Future -  CBC with Differential; Future  Obesity, morbid - Lipid Panel; Future - Hemoglobin A1c; Future - Basic Metabolic Panel; Future - Hepatic Function Panel; Future - CBC with Differential; Future   Call the office with any questions or concerns. CPX next month as scheduled. Recheck sooner as needed.

## 2014-01-01 ENCOUNTER — Telehealth: Payer: Self-pay | Admitting: Family

## 2014-01-01 NOTE — Telephone Encounter (Signed)
emmi emailed °

## 2014-02-26 ENCOUNTER — Other Ambulatory Visit (INDEPENDENT_AMBULATORY_CARE_PROVIDER_SITE_OTHER): Payer: Medicare Other

## 2014-02-26 DIAGNOSIS — E785 Hyperlipidemia, unspecified: Secondary | ICD-10-CM

## 2014-02-26 DIAGNOSIS — I10 Essential (primary) hypertension: Secondary | ICD-10-CM

## 2014-02-26 DIAGNOSIS — R739 Hyperglycemia, unspecified: Secondary | ICD-10-CM

## 2014-02-26 LAB — HEPATIC FUNCTION PANEL
ALBUMIN: 3.7 g/dL (ref 3.5–5.2)
ALT: 16 U/L (ref 0–35)
AST: 22 U/L (ref 0–37)
Alkaline Phosphatase: 65 U/L (ref 39–117)
BILIRUBIN TOTAL: 0.4 mg/dL (ref 0.2–1.2)
Bilirubin, Direct: 0 mg/dL (ref 0.0–0.3)
Total Protein: 7.5 g/dL (ref 6.0–8.3)

## 2014-02-26 LAB — BASIC METABOLIC PANEL
BUN: 15 mg/dL (ref 6–23)
CALCIUM: 8.9 mg/dL (ref 8.4–10.5)
CO2: 27 mEq/L (ref 19–32)
Chloride: 106 mEq/L (ref 96–112)
Creatinine, Ser: 0.7 mg/dL (ref 0.4–1.2)
GFR: 107.08 mL/min (ref >60.00)
GLUCOSE: 117 mg/dL — AB (ref 70–99)
Potassium: 3.7 mEq/L (ref 3.5–5.1)
Sodium: 139 mEq/L (ref 135–145)

## 2014-02-26 LAB — POCT URINALYSIS DIPSTICK
BILIRUBIN UA: NEGATIVE
Glucose, UA: NEGATIVE
KETONES UA: NEGATIVE
LEUKOCYTES UA: NEGATIVE
Nitrite, UA: NEGATIVE
PH UA: 5
Protein, UA: NEGATIVE
RBC UA: NEGATIVE
SPEC GRAV UA: 1.025
Urobilinogen, UA: 0.2

## 2014-02-26 LAB — CBC WITH DIFFERENTIAL/PLATELET
BASOS PCT: 0.7 % (ref 0.0–3.0)
Basophils Absolute: 0 10*3/uL (ref 0.0–0.1)
EOS PCT: 2.3 % (ref 0.0–5.0)
Eosinophils Absolute: 0.1 10*3/uL (ref 0.0–0.7)
HEMATOCRIT: 38.7 % (ref 36.0–46.0)
HEMOGLOBIN: 12.3 g/dL (ref 12.0–15.0)
LYMPHS ABS: 2.1 10*3/uL (ref 0.7–4.0)
Lymphocytes Relative: 33 % (ref 12.0–46.0)
MCHC: 31.9 g/dL (ref 30.0–36.0)
MCV: 87.7 fl (ref 78.0–100.0)
MONOS PCT: 10 % (ref 3.0–12.0)
Monocytes Absolute: 0.6 10*3/uL (ref 0.1–1.0)
NEUTROS ABS: 3.4 10*3/uL (ref 1.4–7.7)
Neutrophils Relative %: 54 % (ref 43.0–77.0)
Platelets: 166 10*3/uL (ref 150.0–400.0)
RBC: 4.41 Mil/uL (ref 3.87–5.11)
RDW: 14.1 % (ref 11.5–15.5)
WBC: 6.3 10*3/uL (ref 4.0–10.5)

## 2014-02-26 LAB — LIPID PANEL
CHOL/HDL RATIO: 5
Cholesterol: 238 mg/dL — ABNORMAL HIGH (ref 0–200)
HDL: 47.5 mg/dL (ref >39.00)
LDL CALC: 167 mg/dL — AB (ref 0–99)
NONHDL: 190.5
TRIGLYCERIDES: 116 mg/dL (ref 0.0–149.0)
VLDL: 23.2 mg/dL (ref 0.0–40.0)

## 2014-02-26 LAB — HEMOGLOBIN A1C: Hgb A1c MFr Bld: 7.5 % — ABNORMAL HIGH (ref 4.6–6.5)

## 2014-02-26 LAB — TSH: TSH: 1.06 u[IU]/mL (ref 0.35–4.50)

## 2014-03-04 ENCOUNTER — Encounter: Payer: Self-pay | Admitting: Family

## 2014-03-04 ENCOUNTER — Ambulatory Visit (INDEPENDENT_AMBULATORY_CARE_PROVIDER_SITE_OTHER): Payer: Medicare Other | Admitting: Family

## 2014-03-04 VITALS — BP 158/80 | HR 97 | Temp 98.3°F | Ht 68.5 in | Wt 248.3 lb

## 2014-03-04 DIAGNOSIS — E7800 Pure hypercholesterolemia, unspecified: Secondary | ICD-10-CM

## 2014-03-04 DIAGNOSIS — Z0000 Encounter for general adult medical examination without abnormal findings: Secondary | ICD-10-CM

## 2014-03-04 DIAGNOSIS — IMO0002 Reserved for concepts with insufficient information to code with codable children: Secondary | ICD-10-CM

## 2014-03-04 DIAGNOSIS — I10 Essential (primary) hypertension: Secondary | ICD-10-CM

## 2014-03-04 DIAGNOSIS — E1165 Type 2 diabetes mellitus with hyperglycemia: Secondary | ICD-10-CM

## 2014-03-04 NOTE — Progress Notes (Signed)
Pre visit review using our clinic review tool, if applicable. No additional management support is needed unless otherwise documented below in the visit note. 

## 2014-03-04 NOTE — Patient Instructions (Signed)
Diabetes and Standards of Medical Care Diabetes is complicated. You may find that your diabetes team includes a dietitian, nurse, diabetes educator, eye doctor, and more. To help everyone know what is going on and to help you get the care you deserve, the following schedule of care was developed to help keep you on track. Below are the tests, exams, vaccines, medicines, education, and plans you will need. HbA1c test This test shows how well you have controlled your glucose over the past 2-3 months. It is used to see if your diabetes management plan needs to be adjusted.   It is performed at least 2 times a year if you are meeting treatment goals.  It is performed 4 times a year if therapy has changed or if you are not meeting treatment goals. Blood pressure test  This test is performed at every routine medical visit. The goal is less than 140/90 mm Hg for most people, but 130/80 mm Hg in some cases. Ask your health care provider about your goal. Dental exam  Follow up with the dentist regularly. Eye exam  If you are diagnosed with type 1 diabetes as a child, get an exam upon reaching the age of 37 years or older and have had diabetes for 3-5 years. Yearly eye exams are recommended after that initial eye exam.  If you are diagnosed with type 1 diabetes as an adult, get an exam within 5 years of diagnosis and then yearly.  If you are diagnosed with type 2 diabetes, get an exam as soon as possible after the diagnosis and then yearly. Foot care exam  Visual foot exams are performed at every routine medical visit. The exams check for cuts, injuries, or other problems with the feet.  A comprehensive foot exam should be done yearly. This includes visual inspection as well as assessing foot pulses and testing for loss of sensation.  Check your feet nightly for cuts, injuries, or other problems with your feet. Tell your health care provider if anything is not healing. Kidney function test (urine  microalbumin)  This test is performed once a year.  Type 1 diabetes: The first test is performed 5 years after diagnosis.  Type 2 diabetes: The first test is performed at the time of diagnosis.  A serum creatinine and estimated glomerular filtration rate (eGFR) test is done once a year to assess the level of chronic kidney disease (CKD), if present. Lipid profile (cholesterol, HDL, LDL, triglycerides)  Performed every 5 years for most people.  The goal for LDL is less than 100 mg/dL. If you are at high risk, the goal is less than 70 mg/dL.  The goal for HDL is 40 mg/dL-50 mg/dL for men and 50 mg/dL-60 mg/dL for women. An HDL cholesterol of 60 mg/dL or higher gives some protection against heart disease.  The goal for triglycerides is less than 150 mg/dL. Influenza vaccine, pneumococcal vaccine, and hepatitis B vaccine  The influenza vaccine is recommended yearly.  It is recommended that people with diabetes who are over 24 years old get the pneumonia vaccine. In some cases, two separate shots may be given. Ask your health care provider if your pneumonia vaccination is up to date.  The hepatitis B vaccine is also recommended for adults with diabetes. Diabetes self-management education  Education is recommended at diagnosis and ongoing as needed. Treatment plan  Your treatment plan is reviewed at every medical visit. Document Released: 12/20/2008 Document Revised: 07/09/2013 Document Reviewed: 07/25/2012 [facility] Patient Information 2015 [place]. This information is not intended to replace advice given to you by your health care provider. Make sure you discuss any questions you have with your health care provider.

## 2014-03-04 NOTE — Progress Notes (Signed)
Subjective:    Patient ID: Shelly Yoder, female    DOB: 10-03-1945, 68 y.o.   MRN: [id number]  HPI 68 year old African-American female, nonsmoker with a history of hyperglycemia, hyperlipidemia, obesity, and hypertension is in today for a well care exam. On her labs, she was found to be hyperglycemic and worsening hyperlipidemia. A1c was 7.5. LDL was 163. I have advised the recommendations for treatment of type 2 diabetes, hypertension and hyperlipidemia.  TNose: his is a routine wellness  examination for this patient . I reviewed all health maintenance protocols including mammography, colonoscopy, bone density Needed referrals were placed. Age and diagnosis  appropriate screening labs were ordered. Her immunization history was reviewed and appropriate vaccinations were ordered. Her current medications and allergies were reviewed and needed refills of her chronic medications were ordered. The plan for yearly health maintenance was discussed all orders and referrals were made as appropriate.  Review of Systems  Constitutional: Negative.   HENT: Negative.   Eyes: Negative.   Respiratory: Negative.   Cardiovascular: Negative.   Gastrointestinal: Negative.   Endocrine: Negative.   Genitourinary: Negative.   Musculoskeletal: Negative.   Skin: Negative.   Allergic/Immunologic: Negative.   Neurological: Negative.   Hematological: Negative.   Psychiatric/Behavioral: Negative.    Past Medical History  Diagnosis Date  . Anemia   . Heart murmur   . Hyperlipidemia   . Hypertension   . Lipoma     History   Social History  . Marital Status: Married    Spouse Name: N/A    Number of Children: N/A  . Years of Education: N/A   Occupational History  . Not on file.   Social History Main Topics  . Smoking status: Former Smoker    Quit date: 06/15/1975  . Smokeless tobacco: Not on file  . Alcohol Use: Not on file  . Drug Use: Not on file  . Sexual Activity: Not on file   Other Topics  Concern  . Not on file   Social History Narrative    Past Surgical History  Procedure Laterality Date  . Abdominal hysterectomy  1996    Family History  Problem Relation Age of Onset  . Diabetes Mother   . Heart attack Mother   . Hyperlipidemia Mother   . Heart disease Mother 72    sudden death  . Alcohol abuse Father   . Diabetes Father   . Heart disease Father     chf    No Known Allergies  No current outpatient prescriptions on file prior to visit.   No current facility-administered medications on file prior to visit.    BP 158/80 mmHg  Pulse 97  Temp(Src) 98.3 F (36.8 C) (Oral)  Ht 5' 8.5" (1.74 m)  Wt 248 lb 4.8 oz (112.628 kg)  BMI 37.20 kg/m2chart    Objective:   Physical Exam  Constitutional: She is oriented to person, place, and time. She appears well-developed and well-nourished.  HENT:  Head: Normocephalic.  Right Ear: External ear normal.  Left Ear: External ear normal.  Nose: Nose normal.  Mouth/Throat: Oropharynx is clear and moist.  Eyes: Conjunctivae and EOM are normal. Pupils are equal, round, and reactive to light.  Neck: Normal range of motion. Neck supple. No thyromegaly present.  Cardiovascular: Normal rate, regular rhythm and normal heart sounds.   Pulmonary/Chest: Effort normal and breath sounds normal.  Abdominal: Soft. Bowel sounds are normal.  Musculoskeletal: Normal range of motion.  Neurological: She is alert and oriented  to person, place, and time. She has normal reflexes.  Skin: Skin is warm and dry.  Psychiatric: She has a normal mood and affect.          Assessment & Plan:  Inella was seen today for annual exam.  Diagnoses and associated orders for this visit:  Medicare annual wellness visit, subsequent - EKG 12-Lead  Type 2 diabetes mellitus, uncontrolled - EKG 12-Lead - Ambulatory referral to diabetic education  Essential hypertension - EKG 12-Lead  Pure hypercholesterolemia - EKG 12-Lead    Patient  has decided to try 3 months of lifestyle modifications, weight reduction of about 10%. We will recheck her fasting at that visit and determine whether or not we need to start medications for elevated glucose and cholesterol. Blood pressure is also elevated today but has been taking decongestants. Advised not to take decongestants.

## 2014-03-26 ENCOUNTER — Ambulatory Visit: Payer: Medicare Other

## 2014-04-02 ENCOUNTER — Ambulatory Visit: Payer: Medicare Other

## 2014-04-09 ENCOUNTER — Ambulatory Visit: Payer: Medicare Other

## 2014-04-30 ENCOUNTER — Encounter: Payer: Self-pay | Admitting: Gastroenterology

## 2014-06-03 ENCOUNTER — Ambulatory Visit: Payer: Medicare Other | Admitting: Family

## 2014-07-23 DIAGNOSIS — I10 Essential (primary) hypertension: Secondary | ICD-10-CM | POA: Diagnosis not present

## 2014-07-23 DIAGNOSIS — Z0000 Encounter for general adult medical examination without abnormal findings: Secondary | ICD-10-CM | POA: Diagnosis not present

## 2014-08-06 DIAGNOSIS — Z79899 Other long term (current) drug therapy: Secondary | ICD-10-CM | POA: Diagnosis not present

## 2014-08-06 DIAGNOSIS — I10 Essential (primary) hypertension: Secondary | ICD-10-CM | POA: Diagnosis not present

## 2014-08-06 DIAGNOSIS — E780 Pure hypercholesterolemia: Secondary | ICD-10-CM | POA: Diagnosis not present

## 2014-08-06 DIAGNOSIS — Z0000 Encounter for general adult medical examination without abnormal findings: Secondary | ICD-10-CM | POA: Diagnosis not present

## 2014-08-06 DIAGNOSIS — E119 Type 2 diabetes mellitus without complications: Secondary | ICD-10-CM | POA: Diagnosis not present

## 2014-09-02 ENCOUNTER — Other Ambulatory Visit: Payer: Self-pay

## 2014-10-28 ENCOUNTER — Ambulatory Visit: Payer: Self-pay | Admitting: Family Medicine

## 2014-12-04 DIAGNOSIS — Z531 Procedure and treatment not carried out because of patient's decision for reasons of belief and group pressure: Secondary | ICD-10-CM

## 2014-12-04 DIAGNOSIS — IMO0001 Reserved for inherently not codable concepts without codable children: Secondary | ICD-10-CM

## 2014-12-04 HISTORY — DX: Reserved for inherently not codable concepts without codable children: IMO0001

## 2014-12-04 HISTORY — DX: Procedure and treatment not carried out because of patient's decision for reasons of belief and group pressure: Z53.1

## 2014-12-20 ENCOUNTER — Other Ambulatory Visit: Payer: Self-pay

## 2014-12-20 DIAGNOSIS — Z1231 Encounter for screening mammogram for malignant neoplasm of breast: Secondary | ICD-10-CM

## 2015-01-06 ENCOUNTER — Ambulatory Visit
Admission: RE | Admit: 2015-01-06 | Discharge: 2015-01-06 | Disposition: A | Discharge status: Home / Self Care | Payer: Medicare Other | Source: Ambulatory Visit

## 2015-01-06 DIAGNOSIS — Z1231 Encounter for screening mammogram for malignant neoplasm of breast: Secondary | ICD-10-CM

## 2015-01-24 ENCOUNTER — Telehealth: Payer: Self-pay

## 2015-01-24 NOTE — Telephone Encounter (Signed)
Pt due for a follow up appt.  Called to schedule an appt. No answer. Left a message for call back.

## 2015-03-26 ENCOUNTER — Encounter: Payer: Self-pay | Admitting: Gastroenterology

## 2015-05-22 ENCOUNTER — Ambulatory Visit (AMBULATORY_SURGERY_CENTER): Payer: Self-pay | Admitting: *Deleted

## 2015-05-22 VITALS — Ht 69.0 in | Wt 245.0 lb

## 2015-05-22 DIAGNOSIS — Z86010 Personal history of colonic polyps: Secondary | ICD-10-CM

## 2015-05-22 MED ORDER — NA SULFATE-K SULFATE-MG SULF 17.5-3.13-1.6 GM/177ML PO SOLN: 1.0000 | Freq: Once | ORAL | Status: DC

## 2015-05-22 NOTE — Progress Notes (Signed)
No egg or soy allergy. No anesthesia problems.  No home O2.  No diet meds.  No emmi given.  

## 2015-06-05 ENCOUNTER — Encounter: Payer: Medicare Other | Admitting: Gastroenterology

## 2015-06-12 ENCOUNTER — Ambulatory Visit (AMBULATORY_SURGERY_CENTER): Payer: Medicare Other | Admitting: Gastroenterology

## 2015-06-12 ENCOUNTER — Encounter: Payer: Self-pay | Admitting: Gastroenterology

## 2015-06-12 VITALS — BP 130/73 | HR 64 | Temp 98.7°F | Resp 16 | Ht 69.0 in | Wt 245.0 lb

## 2015-06-12 DIAGNOSIS — Z1211 Encounter for screening for malignant neoplasm of colon: Secondary | ICD-10-CM

## 2015-06-12 DIAGNOSIS — D122 Benign neoplasm of ascending colon: Secondary | ICD-10-CM | POA: Diagnosis not present

## 2015-06-12 DIAGNOSIS — D124 Benign neoplasm of descending colon: Secondary | ICD-10-CM

## 2015-06-12 DIAGNOSIS — D123 Benign neoplasm of transverse colon: Secondary | ICD-10-CM

## 2015-06-12 DIAGNOSIS — D125 Benign neoplasm of sigmoid colon: Secondary | ICD-10-CM

## 2015-06-12 HISTORY — PX: COLONOSCOPY: SHX174

## 2015-06-12 MED ORDER — SODIUM CHLORIDE 0.9 % IV SOLN: 500.0000 mL | INTRAVENOUS | Status: DC

## 2015-06-12 NOTE — Op Note (Signed)
[place] Patient Name: Shelly Yoder Procedure Date: 06/12/2015 12:04 PM MRN: [id number] Endoscopist: Ladene Artist , MD Age: 70 Date of Birth: 10-Feb-1946 Gender: Female Procedure:                Colonoscopy Indications:              Screening for colorectal malignant neoplasm, Last                            colonoscopy: April 2004 Medicines:                Monitored Anesthesia Care Procedure:                Pre-Anesthesia Assessment:                           - Prior to the procedure, a History and Physical                            was performed, and patient medications and                            allergies were reviewed. The patient's tolerance of                            previous anesthesia was also reviewed. The risks                            and benefits of the procedure and the sedation                            options and risks were discussed with the patient.                            All questions were answered, and informed consent                            was obtained. Prior Anticoagulants: The patient has                            taken no previous anticoagulant or antiplatelet                            agents. ASA Grade Assessment: II - A patient with                            mild systemic disease. After reviewing the risks                            and benefits, the patient was deemed in                            satisfactory condition to undergo the procedure.  After obtaining informed consent, the colonoscope                            was passed under direct vision. Throughout the                            procedure, the patient's blood pressure, pulse, and                            oxygen saturations were monitored continuously. The                            Model PCF-[id number] scope was introduced                            through the anus and advanced to the the cecum,   identified by appendiceal orifice and ileocecal                            valve. The colonoscopy was performed without                            difficulty. The patient tolerated the procedure                            well. The quality of the bowel preparation was                            good. The ileocecal valve, appendiceal orifice, and                            rectum were photographed. Scope In: 12:10:02 PM Scope Out: 12:32:20 PM Scope Withdrawal Time: 0 hours 19 minutes 0 seconds  Total Procedure Duration: 0 hours 22 minutes 18 seconds  Findings:                 The digital rectal exam was normal.                           Three pedunculated polyps were found in the                            ascending colon. The polyps were 10-11 mm in size.                            These polyps were removed with a cold snare.                            Resection and retrieval were complete.                           A 6 mm polyp was found in the descending colon. The  polyp was sessile. The polyp was removed with a                            cold snare. Resection and retrieval were complete.                           Four sessile polyps were found in the sigmoid colon                            (1), transverse colon (2) and ascending colon (1).                            The polyps were 4 to 5 mm in size. These polyps                            were removed with a cold biopsy forceps. Resection                            and retrieval were complete.                           A few small-mouthed diverticula were found in the                            sigmoid colon.                           The exam was otherwise without abnormality.                           No abnormalities were found on retroflexion. Complications:            No immediate complications. Estimated Blood Loss:     Estimated blood loss was minimal. Impression:               - Three 10-11 mm  polyps in the ascending colon,                            removed with a cold snare. Resected and retrieved.                           - One 6 mm polyp in the descending colon, removed                            with a cold snare. Resected and retrieved.                           - Four 4 to 5 mm polyps in the sigmoid colon, in                            the transverse colon and in the ascending colon,  removed with a cold biopsy forceps. Resected and                            retrieved.                           - Diverticulosis in the sigmoid colon. Recommendation:           - Patient has a contact number available for                            emergencies. The signs and symptoms of potential                            delayed complications were discussed with the                            patient. Return to normal activities tomorrow.                            Written discharge instructions were provided to the                            patient.                           - Resume previous diet.                           - Continue present medications.                           - No aspirin, ibuprofen, naproxen, or other                            non-steroidal anti-inflammatory drugs for 2 weeks                            after polyp removal.                           - Await pathology results.                           - Repeat colonoscopy in 3 years for surveillance. Ladene Artist, MD 06/12/2015 12:43:25 PM This report has been signed electronically.

## 2015-06-12 NOTE — Progress Notes (Signed)
Called to room to assist during endoscopic procedure.  Patient ID and intended procedure confirmed with present staff. Received instructions for my participation in the procedure from the performing physician.  

## 2015-06-12 NOTE — Progress Notes (Signed)
A/ox3 pleased with MAC, report to Jane RN 

## 2015-06-12 NOTE — Patient Instructions (Signed)
FOLLOW DISCHARGE INSTRUCTIONS (BLUE AND GREEN SHEETS).YOU HAD AN ENDOSCOPIC PROCEDURE TODAY AT [facility]:   Refer to the procedure report that was given to you for any specific questions about what was found during the examination.  If the procedure report does not answer your questions, please call your gastroenterologist to clarify.  If you requested that your care partner not be given the details of your procedure findings, then the procedure report has been included in a sealed envelope for you to review at your convenience later.  YOU SHOULD EXPECT: Some feelings of bloating in the abdomen. Passage of more gas than usual.  Walking can help get rid of the air that was put into your GI tract during the procedure and reduce the bloating. If you had a lower endoscopy (such as a colonoscopy or flexible sigmoidoscopy) you may notice spotting of blood in your stool or on the toilet paper. If you underwent a bowel prep for your procedure, you may not have a normal bowel movement for a few days.  Please Note:  You might notice some irritation and congestion in your nose or some drainage.  This is from the oxygen used during your procedure.  There is no need for concern and it should clear up in a day or so.  SYMPTOMS TO REPORT IMMEDIATELY:   Following lower endoscopy (colonoscopy or flexible sigmoidoscopy):  Excessive amounts of blood in the stool  Significant tenderness or worsening of abdominal pains  Swelling of the abdomen that is new, acute  Fever of 100F or higher    For urgent or emergent issues, a gastroenterologist can be reached at any hour by calling [phone].   DIET: Your first meal following the procedure should be a small meal and then it is ok to progress to your normal diet. Heavy or fried foods are harder to digest and may make you feel nauseous or bloated.  Likewise, meals heavy in dairy and vegetables can increase bloating.  Drink plenty of fluids but you  should avoid alcoholic beverages for 24 hours.  ACTIVITY:  You should plan to take it easy for the rest of today and you should NOT DRIVE or use heavy machinery until tomorrow (because of the sedation medicines used during the test).    FOLLOW UP: Our staff will call the number listed on your records the next business day following your procedure to check on you and address any questions or concerns that you may have regarding the information given to you following your procedure. If we do not reach you, we will leave a message.  However, if you are feeling well and you are not experiencing any problems, there is no need to return our call.  We will assume that you have returned to your regular daily activities without incident.  If any biopsies were taken you will be contacted by phone or by letter within the next 1-3 weeks.  Please call us at [phone] if you have not heard about the biopsies in 3 weeks.    SIGNATURES/CONFIDENTIALITY: You and/or your care partner have signed paperwork which will be entered into your electronic medical record.  These signatures attest to the fact that that the information above on your After Visit Summary has been reviewed and is understood.  Full responsibility of the confidentiality of this discharge information lies with you and/or your care-partner.  Polyps, diverticulosis, high fiber diet information given. No aspirin, ibuprofen, naproxen, or other non steroidal anti inflammatory  meds for 2 weeks Next colonoscopy 3 yearsss

## 2015-06-13 ENCOUNTER — Telehealth: Payer: Self-pay | Admitting: *Deleted

## 2015-06-13 NOTE — Telephone Encounter (Signed)
Left message for patient to call if she has any questions or concerns. Sm

## 2015-06-16 ENCOUNTER — Telehealth: Payer: Self-pay | Admitting: Gastroenterology

## 2015-06-16 NOTE — Telephone Encounter (Signed)
Left message for patient to call back  

## 2015-06-18 ENCOUNTER — Encounter: Payer: Self-pay | Admitting: Gastroenterology

## 2015-06-18 NOTE — Telephone Encounter (Signed)
Left message for patient to call back  

## 2015-06-23 NOTE — Telephone Encounter (Signed)
No return call from the patient.  I will await a return call from her.  

## 2016-02-02 ENCOUNTER — Other Ambulatory Visit: Payer: Self-pay | Admitting: Geriatric Medicine

## 2016-02-02 DIAGNOSIS — Z1231 Encounter for screening mammogram for malignant neoplasm of breast: Secondary | ICD-10-CM

## 2016-02-24 ENCOUNTER — Ambulatory Visit
Admission: RE | Admit: 2016-02-24 | Discharge: 2016-02-24 | Disposition: A | Discharge status: Home / Self Care | Payer: Medicare Other | Source: Ambulatory Visit | Attending: Geriatric Medicine | Admitting: Geriatric Medicine

## 2016-02-24 DIAGNOSIS — Z1231 Encounter for screening mammogram for malignant neoplasm of breast: Secondary | ICD-10-CM

## 2016-12-09 ENCOUNTER — Other Ambulatory Visit: Payer: Self-pay | Admitting: Family Medicine

## 2016-12-09 DIAGNOSIS — Z1231 Encounter for screening mammogram for malignant neoplasm of breast: Secondary | ICD-10-CM

## 2016-12-27 ENCOUNTER — Ambulatory Visit: Payer: Medicare Other | Admitting: Family Medicine

## 2017-01-14 DIAGNOSIS — H2513 Age-related nuclear cataract, bilateral: Secondary | ICD-10-CM | POA: Diagnosis not present

## 2017-01-14 DIAGNOSIS — E119 Type 2 diabetes mellitus without complications: Secondary | ICD-10-CM | POA: Diagnosis not present

## 2017-01-14 DIAGNOSIS — H04123 Dry eye syndrome of bilateral lacrimal glands: Secondary | ICD-10-CM | POA: Diagnosis not present

## 2017-02-21 ENCOUNTER — Ambulatory Visit: Payer: Medicare Other | Admitting: Family Medicine

## 2017-02-24 ENCOUNTER — Ambulatory Visit
Admission: RE | Admit: 2017-02-24 | Discharge: 2017-02-24 | Disposition: A | Discharge status: Home / Self Care | Payer: Medicare Other | Source: Ambulatory Visit | Attending: Family Medicine | Admitting: Family Medicine

## 2017-02-24 DIAGNOSIS — Z1231 Encounter for screening mammogram for malignant neoplasm of breast: Secondary | ICD-10-CM | POA: Diagnosis not present

## 2017-04-12 ENCOUNTER — Ambulatory Visit: Payer: Medicare Other | Admitting: Family Medicine

## 2017-05-19 ENCOUNTER — Ambulatory Visit: Payer: Medicare Other | Admitting: Family Medicine

## 2017-12-26 ENCOUNTER — Ambulatory Visit: Payer: Medicare Other | Admitting: Family Medicine

## 2018-01-16 ENCOUNTER — Other Ambulatory Visit: Payer: Self-pay | Admitting: Family Medicine

## 2018-01-16 DIAGNOSIS — Z1231 Encounter for screening mammogram for malignant neoplasm of breast: Secondary | ICD-10-CM

## 2018-01-19 ENCOUNTER — Encounter: Payer: Self-pay | Admitting: Family Medicine

## 2018-01-19 DIAGNOSIS — E119 Type 2 diabetes mellitus without complications: Secondary | ICD-10-CM | POA: Diagnosis not present

## 2018-01-19 DIAGNOSIS — H04123 Dry eye syndrome of bilateral lacrimal glands: Secondary | ICD-10-CM | POA: Diagnosis not present

## 2018-01-19 DIAGNOSIS — H2513 Age-related nuclear cataract, bilateral: Secondary | ICD-10-CM | POA: Diagnosis not present

## 2018-01-19 LAB — HM DIABETES EYE EXAM

## 2018-03-06 ENCOUNTER — Ambulatory Visit
Admission: RE | Admit: 2018-03-06 | Discharge: 2018-03-06 | Disposition: A | Discharge status: Home / Self Care | Payer: Medicare Other | Source: Ambulatory Visit | Attending: Family Medicine | Admitting: Family Medicine

## 2018-03-06 DIAGNOSIS — Z1231 Encounter for screening mammogram for malignant neoplasm of breast: Secondary | ICD-10-CM | POA: Diagnosis not present

## 2018-03-26 NOTE — Progress Notes (Signed)
Shelly Yoder is a 73 y.o. female is here to [facility].   Patient Care Team: Briscoe Deutscher, DO as PCP - General (Family Medicine) Ladene Artist, MD as Consulting Physician (Gastroenterology)   History of Present Illness:   HPI: See Assessment and Plan section for Problem Based Charting of issues discussed today.   Health Maintenance Due  Topic Date Due  . FOOT EXAM  05/02/1956  . TETANUS/TDAP  05/02/1965  . DEXA SCAN  05/03/2011  . PNA vac Low Risk Adult (1 of 2 - PCV13) 05/03/2011  . INFLUENZA VACCINE  10/06/2017   Depression screen PHQ 2/9 03/27/2018  Decreased Interest 0  Down, Depressed, Hopeless 1  PHQ - 2 Score 1  Altered sleeping 0  Tired, decreased energy 1  Change in appetite 2  Feeling bad or failure about yourself  0  Trouble concentrating 0  Moving slowly or fidgety/restless 0  Suicidal thoughts 0  PHQ-9 Score 4  Difficult doing work/chores Somewhat difficult   PMHx, SurgHx, SocialHx, Medications, and Allergies were reviewed in the Visit Navigator and updated as appropriate.   Past Medical History:  Diagnosis Date  . Anemia   . Bilateral hearing loss 03/29/2018  . Cataracts, bilateral   . Colon polyps   . Diabetes mellitus without complication ([place]) 1062  . Heart murmur   . Hyperlipidemia   . Hyperlipidemia associated with type 2 diabetes mellitus ([place]) 03/29/2018  . Hypertension   . Hypertension associated with diabetes ([place]) 03/29/2018  . Lipoma   . Lipoma of torso 03/29/2018  . Refusal of blood transfusions as patient is Jehovah's Witness 12/04/2014  . Type 2 diabetes mellitus with hyperglycemia, without long-term current use of insulin ([place]) 03/29/2018  . Vitamin D deficiency 03/29/2018     Past Surgical History:  Procedure Laterality Date  . ABDOMINAL HYSTERECTOMY  1996   partial   . BREAST BIOPSY Right   . COLONOSCOPY    . LIPOMA EXCISION     upper back     Family History  Problem Relation Age of Onset  . Diabetes Mother     . Heart attack Mother   . Hyperlipidemia Mother   . Heart disease Mother 61  . Heart block Mother   . Kidney disease Mother   . Alcohol abuse Father   . Diabetes Father   . Heart disease Father   . Kidney disease Father   . Breast cancer Sister   . Diabetes Brother   . Hypertension Brother   . Kidney disease Brother   . Colon cancer Neg Hx   . Esophageal cancer Neg Hx   . Stomach cancer Neg Hx   . Rectal cancer Neg Hx     Social History   Tobacco Use  . Smoking status: Former Smoker    Last attempt to quit: 06/15/1975    Years since quitting: 42.8  . Smokeless tobacco: Never Used  Substance Use Topics  . Alcohol use: Yes    Alcohol/week: 0.0 standard drinks    Comment: occasionally Wine, liqour and beer once a month  . Drug use: No    Current Medications and Allergies:   .  Cyanocobalamin (B-12 PO), Take by mouth., Disp: , Rfl:  .  aspirin EC 81 MG tablet, Take 81 mg by mouth., Disp: , Rfl:  .  atenolol (TENORMIN) 25 MG tablet, Take 25 mg by mouth daily., Disp: , Rfl:  .  glucose blood test strip, OneTouch Verio - Use when checking  BG once daily., Disp: 100 each, Rfl: 3 .  Lancet Devices MISC, One Touch Delica Lancets - use when checking BG, finger stick, once daily., Disp: 100 each, Rfl: 3 .  Multiple Vitamin (MULTI-VITAMINS) TABS, Take by mouth., Disp: , Rfl:   Allergies  Allergen Reactions  . Atorvastatin Other (See Comments)    Muscle aches  . Hydrocodone-Acetaminophen Other (See Comments)   Review of Systems:   Pertinent items are noted in the HPI. Otherwise, a complete ROS is negative.  Vitals:   Vitals:   03/27/18 1059  BP: (!) 160/80  Pulse: 75  Temp: 98.2 F (36.8 C)  TempSrc: Oral  SpO2: 97%  Weight: 238 lb 6.4 oz (108.1 kg)  Height: 5' 8.5" (1.74 m)     Body mass index is 35.72 kg/m.  Physical Exam:   Physical Exam Vitals signs and nursing note reviewed.  HENT:     Head: Normocephalic and atraumatic.  Eyes:     Pupils: Pupils are  equal, round, and reactive to light.  Neck:     Musculoskeletal: Normal range of motion and neck supple.  Cardiovascular:     Rate and Rhythm: Normal rate and regular rhythm.     Heart sounds: Normal heart sounds.  Pulmonary:     Effort: Pulmonary effort is normal.  Abdominal:     Palpations: Abdomen is soft.  Skin:    General: Skin is warm.  Psychiatric:        Behavior: Behavior normal.    Results for orders placed or performed in visit on 03/27/18  CBC with Differential/Platelet  Result Value Ref Range   WBC 6.9 4.0 - 10.5 K/uL   RBC 4.42 3.87 - 5.11 Mil/uL   Hemoglobin 12.5 12.0 - 15.0 g/dL   HCT 37.9 36.0 - 46.0 %   MCV 85.8 78.0 - 100.0 fl   MCHC 33.1 30.0 - 36.0 g/dL   RDW 13.5 11.5 - 15.5 %   Platelets 201.0 150.0 - 400.0 K/uL   Neutrophils Relative % 58.7 43.0 - 77.0 %   Lymphocytes Relative 30.4 12.0 - 46.0 %   Monocytes Relative 8.3 3.0 - 12.0 %   Eosinophils Relative 1.8 0.0 - 5.0 %   Basophils Relative 0.8 0.0 - 3.0 %   Neutro Abs 4.1 1.4 - 7.7 K/uL   Lymphs Abs 2.1 0.7 - 4.0 K/uL   Monocytes Absolute 0.6 0.1 - 1.0 K/uL   Eosinophils Absolute 0.1 0.0 - 0.7 K/uL   Basophils Absolute 0.1 0.0 - 0.1 K/uL  Comprehensive metabolic panel  Result Value Ref Range   Sodium 140 135 - 145 mEq/L   Potassium 4.1 3.5 - 5.1 mEq/L   Chloride 104 96 - 112 mEq/L   CO2 29 19 - 32 mEq/L   Glucose, Bld 125 (H) 70 - 99 mg/dL   BUN 10 6 - 23 mg/dL   Creatinine, Ser 0.68 0.40 - 1.20 mg/dL   Total Bilirubin 0.3 0.2 - 1.2 mg/dL   Alkaline Phosphatase 71 39 - 117 U/L   AST 16 0 - 37 U/L   ALT 10 0 - 35 U/L   Total Protein 7.3 6.0 - 8.3 g/dL   Albumin 4.0 3.5 - 5.2 g/dL   Calcium 9.5 8.4 - 10.5 mg/dL   GFR 102.94 >60.00 mL/min  Hemoglobin A1c  Result Value Ref Range   Hgb A1c MFr Bld 7.8 (H) 4.6 - 6.5 %  Lipid panel  Result Value Ref Range   Cholesterol 271 (H) 0 -  200 mg/dL   Triglycerides 127.0 0.0 - 149.0 mg/dL   HDL 55.50 >39.00 mg/dL   VLDL 25.4 0.0 - 40.0 mg/dL    LDL Cholesterol 190 (H) 0 - 99 mg/dL   Total CHOL/HDL Ratio 5    NonHDL 215.07   TSH  Result Value Ref Range   TSH 0.68 0.35 - 4.50 uIU/mL  Vitamin B12  Result Value Ref Range   Vitamin B-12 827 211 - 911 pg/mL  VITAMIN D 25 Hydroxy (Vit-D Deficiency, Fractures)  Result Value Ref Range   VITD 20.48 (L) 30.00 - 100.00 ng/mL  Microalbumin / creatinine urine ratio  Result Value Ref Range   Microalb, Ur 1.9 0.0 - 1.9 mg/dL   Creatinine,U 134.3 mg/dL   Microalb Creat Ratio 1.4 0.0 - 30.0 mg/g   Assessment and Plan:   Vitamin D deficiency Checked at visit. Advised supplementation.  Type 2 diabetes mellitus with hyperglycemia, without long-term current use of insulin (HCC) History: Diabetic diet compliance: noncompliant some of the time, home glucose monitoring: is not performed, further diabetic ROS: no polyuria or polydipsia, no chest pain, dyspnea or TIA's, no numbness, tingling or pain in extremities. Assessment:        Diabetes Mellitus: control uncertain.  Plan: 1.  Patient is counseled on appropriate foot care. 2.  BP goal < 130/80. 3.  LDL goal of < 100, HDL > 40 and TG < 150.  4.  Eye Exam yearly and Dental Exam every 6 months. 5.  Dietary recommendations: < 100 g carbohydrates daily. 6.  Physical Activity recommendations: as tolerated. 7.  Pneumovax at diagnosis and once 65+. Wait five years between first dose and dose after 65.  8.  Influenza annually.   Tinnitus of left ear Ongoing. 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   Refusal of blood transfusions as patient is Jehovah's Witness Chart updated.  Morbid obesity ([place]) The patient is asked to make an attempt to improve diet and exercise patterns to aid in medical management of this problem.   Lipoma of torso Would like removal. Will send to specialist.   Hypertension associated with diabetes ([place]) Not compliant with medication. Cardiovascular ROS: no chest pain or dyspnea on  exertion.   BP Readings from Last 3 Encounters:  03/27/18 (!) 160/80  06/12/15 130/73  03/04/14 (!) 158/80    Hyperlipidemia associated with type 2 diabetes mellitus ([place]) Lab Results  Component Value Date   CHOL 271 (H) 03/27/2018   HDL 55.50 03/27/2018   LDLCALC 190 (H) 03/27/2018   LDLDIRECT 199.2 02/23/2013   TRIG 127.0 03/27/2018   CHOLHDL 5 03/27/2018   Lab Results  Component Value Date   ALT 10 03/27/2018   AST 16 03/27/2018   ALKPHOS 71 03/27/2018   BILITOT 0.3 03/27/2018   Declines medication.  Family history of breast cancer in first degree relative, sister Offered genetic counseling.   Orders Placed This Encounter  Procedures  . CBC with Differential/Platelet  . Comprehensive metabolic panel  . Hemoglobin A1c  . Lipid panel  . TSH  . Vitamin B12  . VITAMIN D 25 Hydroxy (Vit-D Deficiency, Fractures)  . Microalbumin / creatinine urine ratio  . Ambulatory referral to General Surgery  . Ambulatory referral to ENT   Meds ordered this encounter  Medications  . DISCONTD: glucose blood test strip    Sig: Use as instructed    Dispense:  100 each    Refill:  12  .  DISCONTD: Lancet Devices MISC    Sig: 1 each by Does not apply route as directed.    Dispense:  100 each    Refill:  12    Dx E11.9    . Reviewed expectations re: course of current medical issues. . Discussed self-management of symptoms. . Outlined signs and symptoms indicating need for more acute intervention. . Patient verbalized understanding and all questions were answered. Marland Kitchen Health Maintenance issues including appropriate healthy diet, exercise, and smoking avoidance were discussed with patient. . See orders for this visit as documented in the electronic medical record. . Patient received an After Visit Summary.  Briscoe Deutscher, DO [place] [place] 04/06/2018  Records requested if needed. Time spent with the patient: 45 minutes, of which >50% was spent in obtaining information  about her symptoms, reviewing her previous labs, evaluations, and treatments, counseling her about her condition (please see the discussed topics above), and developing a plan to further investigate it; she had a number of questions which I addressed.

## 2018-03-27 ENCOUNTER — Encounter: Payer: Self-pay | Admitting: Family Medicine

## 2018-03-27 ENCOUNTER — Telehealth: Payer: Self-pay | Admitting: Family Medicine

## 2018-03-27 ENCOUNTER — Ambulatory Visit (INDEPENDENT_AMBULATORY_CARE_PROVIDER_SITE_OTHER): Payer: Medicare Other | Admitting: Family Medicine

## 2018-03-27 VITALS — BP 160/80 | HR 75 | Temp 98.2°F | Ht 68.5 in | Wt 238.4 lb

## 2018-03-27 DIAGNOSIS — R5381 Other malaise: Secondary | ICD-10-CM

## 2018-03-27 DIAGNOSIS — Z531 Procedure and treatment not carried out because of patient's decision for reasons of belief and group pressure: Secondary | ICD-10-CM

## 2018-03-27 DIAGNOSIS — E1169 Type 2 diabetes mellitus with other specified complication: Secondary | ICD-10-CM

## 2018-03-27 DIAGNOSIS — E785 Hyperlipidemia, unspecified: Secondary | ICD-10-CM

## 2018-03-27 DIAGNOSIS — R42 Dizziness and giddiness: Secondary | ICD-10-CM

## 2018-03-27 DIAGNOSIS — Z803 Family history of malignant neoplasm of breast: Secondary | ICD-10-CM

## 2018-03-27 DIAGNOSIS — E559 Vitamin D deficiency, unspecified: Secondary | ICD-10-CM | POA: Diagnosis not present

## 2018-03-27 DIAGNOSIS — R5383 Other fatigue: Secondary | ICD-10-CM

## 2018-03-27 DIAGNOSIS — E1159 Type 2 diabetes mellitus with other circulatory complications: Secondary | ICD-10-CM | POA: Diagnosis not present

## 2018-03-27 DIAGNOSIS — Z806 Family history of leukemia: Secondary | ICD-10-CM

## 2018-03-27 DIAGNOSIS — H9193 Unspecified hearing loss, bilateral: Secondary | ICD-10-CM

## 2018-03-27 DIAGNOSIS — I2510 Atherosclerotic heart disease of native coronary artery without angina pectoris: Secondary | ICD-10-CM

## 2018-03-27 DIAGNOSIS — E1165 Type 2 diabetes mellitus with hyperglycemia: Secondary | ICD-10-CM | POA: Diagnosis not present

## 2018-03-27 DIAGNOSIS — I10 Essential (primary) hypertension: Secondary | ICD-10-CM

## 2018-03-27 DIAGNOSIS — D171 Benign lipomatous neoplasm of skin and subcutaneous tissue of trunk: Secondary | ICD-10-CM

## 2018-03-27 DIAGNOSIS — I152 Hypertension secondary to endocrine disorders: Secondary | ICD-10-CM

## 2018-03-27 DIAGNOSIS — Z90710 Acquired absence of both cervix and uterus: Secondary | ICD-10-CM

## 2018-03-27 DIAGNOSIS — H9312 Tinnitus, left ear: Secondary | ICD-10-CM

## 2018-03-27 DIAGNOSIS — IMO0001 Reserved for inherently not codable concepts without codable children: Secondary | ICD-10-CM

## 2018-03-27 LAB — VITAMIN B12: Vitamin B-12: 827 pg/mL (ref 211–911)

## 2018-03-27 LAB — CBC WITH DIFFERENTIAL/PLATELET
Basophils Absolute: 0.1 10*3/uL (ref 0.0–0.1)
Basophils Relative: 0.8 % (ref 0.0–3.0)
Eosinophils Absolute: 0.1 10*3/uL (ref 0.0–0.7)
Eosinophils Relative: 1.8 % (ref 0.0–5.0)
HCT: 37.9 % (ref 36.0–46.0)
Hemoglobin: 12.5 g/dL (ref 12.0–15.0)
Lymphocytes Relative: 30.4 % (ref 12.0–46.0)
Lymphs Abs: 2.1 10*3/uL (ref 0.7–4.0)
MCHC: 33.1 g/dL (ref 30.0–36.0)
MCV: 85.8 fl (ref 78.0–100.0)
Monocytes Absolute: 0.6 10*3/uL (ref 0.1–1.0)
Monocytes Relative: 8.3 % (ref 3.0–12.0)
Neutro Abs: 4.1 10*3/uL (ref 1.4–7.7)
Neutrophils Relative %: 58.7 % (ref 43.0–77.0)
Platelets: 201 10*3/uL (ref 150.0–400.0)
RBC: 4.42 Mil/uL (ref 3.87–5.11)
RDW: 13.5 % (ref 11.5–15.5)
WBC: 6.9 10*3/uL (ref 4.0–10.5)

## 2018-03-27 LAB — MICROALBUMIN / CREATININE URINE RATIO
Creatinine,U: 134.3 mg/dL
Microalb Creat Ratio: 1.4 mg/g (ref 0.0–30.0)
Microalb, Ur: 1.9 mg/dL (ref 0.0–1.9)

## 2018-03-27 LAB — LIPID PANEL
Cholesterol: 271 mg/dL — ABNORMAL HIGH (ref 0–200)
HDL: 55.5 mg/dL (ref >39.00)
LDL Cholesterol: 190 mg/dL — ABNORMAL HIGH (ref 0–99)
NonHDL: 215.07
Total CHOL/HDL Ratio: 5
Triglycerides: 127 mg/dL (ref 0.0–149.0)
VLDL: 25.4 mg/dL (ref 0.0–40.0)

## 2018-03-27 LAB — COMPREHENSIVE METABOLIC PANEL
ALT: 10 U/L (ref 0–35)
AST: 16 U/L (ref 0–37)
Albumin: 4 g/dL (ref 3.5–5.2)
Alkaline Phosphatase: 71 U/L (ref 39–117)
BUN: 10 mg/dL (ref 6–23)
CO2: 29 mEq/L (ref 19–32)
Calcium: 9.5 mg/dL (ref 8.4–10.5)
Chloride: 104 mEq/L (ref 96–112)
Creatinine, Ser: 0.68 mg/dL (ref 0.40–1.20)
GFR: 102.94 mL/min (ref >60.00)
Glucose, Bld: 125 mg/dL — ABNORMAL HIGH (ref 70–99)
Potassium: 4.1 mEq/L (ref 3.5–5.1)
Sodium: 140 mEq/L (ref 135–145)
Total Bilirubin: 0.3 mg/dL (ref 0.2–1.2)
Total Protein: 7.3 g/dL (ref 6.0–8.3)

## 2018-03-27 LAB — TSH: TSH: 0.68 u[IU]/mL (ref 0.35–4.50)

## 2018-03-27 LAB — VITAMIN D 25 HYDROXY (VIT D DEFICIENCY, FRACTURES): VITD: 20.48 ng/mL — ABNORMAL LOW (ref 30.00–100.00)

## 2018-03-27 LAB — HEMOGLOBIN A1C: Hgb A1c MFr Bld: 7.8 % — ABNORMAL HIGH (ref 4.6–6.5)

## 2018-03-27 MED ORDER — LANCET DEVICES MISC: 1.0000 | 12 refills | Status: DC

## 2018-03-27 MED ORDER — GLUCOSE BLOOD VI STRP: ORAL_STRIP | 12 refills | Status: DC

## 2018-03-27 NOTE — Telephone Encounter (Signed)
See note

## 2018-03-27 NOTE — Telephone Encounter (Signed)
Copied from [place] [phone]. Topic: Quick Communication - See Telephone Encounter >> Mar 27, 2018  2:04 PM Conception Chancy, NT wrote: CRM for notification. See Telephone encounter for: 03/27/18.  Patient is calling and states at her visit today she mentioned that she needed refills on her diabetic supplies.  one touch delica lancets  One touch verio test strips  [facility] [address], [place] [address] Phone: [phone] Fax: [phone]

## 2018-03-27 NOTE — Telephone Encounter (Signed)
Sent in to pharmacy.  

## 2018-03-28 ENCOUNTER — Other Ambulatory Visit: Payer: Self-pay

## 2018-03-28 DIAGNOSIS — E119 Type 2 diabetes mellitus without complications: Secondary | ICD-10-CM

## 2018-03-28 MED ORDER — GLUCOSE BLOOD VI STRP: ORAL_STRIP | 3 refills | Status: DC

## 2018-03-28 MED ORDER — LANCET DEVICES MISC: 3 refills | Status: DC

## 2018-03-29 ENCOUNTER — Encounter: Payer: Self-pay | Admitting: Family Medicine

## 2018-03-29 DIAGNOSIS — E1159 Type 2 diabetes mellitus with other circulatory complications: Secondary | ICD-10-CM

## 2018-03-29 DIAGNOSIS — Z90710 Acquired absence of both cervix and uterus: Secondary | ICD-10-CM | POA: Insufficient documentation

## 2018-03-29 DIAGNOSIS — D171 Benign lipomatous neoplasm of skin and subcutaneous tissue of trunk: Secondary | ICD-10-CM

## 2018-03-29 DIAGNOSIS — I152 Hypertension secondary to endocrine disorders: Secondary | ICD-10-CM

## 2018-03-29 DIAGNOSIS — H9312 Tinnitus, left ear: Secondary | ICD-10-CM | POA: Insufficient documentation

## 2018-03-29 DIAGNOSIS — Z806 Family history of leukemia: Secondary | ICD-10-CM | POA: Insufficient documentation

## 2018-03-29 DIAGNOSIS — E785 Hyperlipidemia, unspecified: Secondary | ICD-10-CM

## 2018-03-29 DIAGNOSIS — Z803 Family history of malignant neoplasm of breast: Secondary | ICD-10-CM | POA: Insufficient documentation

## 2018-03-29 DIAGNOSIS — H9193 Unspecified hearing loss, bilateral: Secondary | ICD-10-CM

## 2018-03-29 DIAGNOSIS — I2510 Atherosclerotic heart disease of native coronary artery without angina pectoris: Secondary | ICD-10-CM | POA: Insufficient documentation

## 2018-03-29 DIAGNOSIS — E1165 Type 2 diabetes mellitus with hyperglycemia: Secondary | ICD-10-CM

## 2018-03-29 DIAGNOSIS — I10 Essential (primary) hypertension: Secondary | ICD-10-CM

## 2018-03-29 DIAGNOSIS — E559 Vitamin D deficiency, unspecified: Secondary | ICD-10-CM | POA: Insufficient documentation

## 2018-03-29 DIAGNOSIS — E1169 Type 2 diabetes mellitus with other specified complication: Secondary | ICD-10-CM

## 2018-03-29 HISTORY — DX: Type 2 diabetes mellitus with hyperglycemia: E11.65

## 2018-03-29 HISTORY — DX: Vitamin D deficiency, unspecified: E55.9

## 2018-03-29 HISTORY — DX: Type 2 diabetes mellitus with other circulatory complications: E11.59

## 2018-03-29 HISTORY — DX: Hypertension secondary to endocrine disorders: I15.2

## 2018-03-29 HISTORY — DX: Type 2 diabetes mellitus with other specified complication: E11.69

## 2018-03-29 HISTORY — DX: Benign lipomatous neoplasm of skin and subcutaneous tissue of trunk: D17.1

## 2018-03-29 HISTORY — DX: Unspecified hearing loss, bilateral: H91.93

## 2018-04-06 ENCOUNTER — Encounter: Payer: Self-pay | Admitting: Family Medicine

## 2018-04-06 NOTE — Assessment & Plan Note (Signed)
The patient is asked to make an attempt to improve diet and exercise patterns to aid in medical management of this problem.  

## 2018-04-06 NOTE — Assessment & Plan Note (Signed)
Lab Results  Component Value Date   CHOL 271 (H) 03/27/2018   HDL 55.50 03/27/2018   LDLCALC 190 (H) 03/27/2018   LDLDIRECT 199.2 02/23/2013   TRIG 127.0 03/27/2018   CHOLHDL 5 03/27/2018   Lab Results  Component Value Date   ALT 10 03/27/2018   AST 16 03/27/2018   ALKPHOS 71 03/27/2018   BILITOT 0.3 03/27/2018   Declines medication.

## 2018-04-06 NOTE — Assessment & Plan Note (Signed)
Not compliant with medication. Cardiovascular ROS: no chest pain or dyspnea on exertion.   BP Readings from Last 3 Encounters:  03/27/18 (!) 160/80  06/12/15 130/73  03/04/14 (!) 158/80

## 2018-04-06 NOTE — Assessment & Plan Note (Signed)
History: Diabetic diet compliance: noncompliant some of the time, home glucose monitoring: is not performed, further diabetic ROS: no polyuria or polydipsia, no chest pain, dyspnea or TIA's, no numbness, tingling or pain in extremities. Assessment:        Diabetes Mellitus: control uncertain.  Plan: 1.  Patient is counseled on appropriate foot care. 2.  BP goal < 130/80. 3.  LDL goal of < 100, HDL > 40 and TG < 150.  4.  Eye Exam yearly and Dental Exam every 6 months. 5.  Dietary recommendations: < 100 g carbohydrates daily. 6.  Physical Activity recommendations: as tolerated. 7.  Pneumovax at diagnosis and once 65+. Wait five years between first dose and dose after 65.  8.  Influenza annually.

## 2018-04-06 NOTE — Assessment & Plan Note (Signed)
Would like removal. Will send to specialist.

## 2018-04-06 NOTE — Assessment & Plan Note (Signed)
Offered genetic counseling.

## 2018-04-06 NOTE — Assessment & Plan Note (Signed)
Ongoing. 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

## 2018-04-06 NOTE — Assessment & Plan Note (Signed)
Checked at visit. Advised supplementation.

## 2018-04-06 NOTE — Assessment & Plan Note (Signed)
Chart updated

## 2018-04-11 ENCOUNTER — Ambulatory Visit (INDEPENDENT_AMBULATORY_CARE_PROVIDER_SITE_OTHER): Payer: Medicare Other | Admitting: Family Medicine

## 2018-04-11 ENCOUNTER — Encounter: Payer: Self-pay | Admitting: Family Medicine

## 2018-04-11 VITALS — BP 118/90 | HR 72 | Temp 97.8°F | Ht 68.5 in | Wt 236.0 lb

## 2018-04-11 DIAGNOSIS — E1169 Type 2 diabetes mellitus with other specified complication: Secondary | ICD-10-CM

## 2018-04-11 DIAGNOSIS — E1159 Type 2 diabetes mellitus with other circulatory complications: Secondary | ICD-10-CM | POA: Diagnosis not present

## 2018-04-11 DIAGNOSIS — I152 Hypertension secondary to endocrine disorders: Secondary | ICD-10-CM

## 2018-04-11 DIAGNOSIS — E1165 Type 2 diabetes mellitus with hyperglycemia: Secondary | ICD-10-CM | POA: Diagnosis not present

## 2018-04-11 DIAGNOSIS — E785 Hyperlipidemia, unspecified: Secondary | ICD-10-CM

## 2018-04-11 DIAGNOSIS — I10 Essential (primary) hypertension: Secondary | ICD-10-CM

## 2018-04-11 NOTE — Progress Notes (Signed)
Shelly Yoder is a 73 y.o. female is here for follow up.  History of Present Illness:   HPI: Patient comes in to discuss labs.   Lab Results  Component Value Date   HGBA1C 7.8 (H) 03/27/2018    Lab Results  Component Value Date   CHOL 271 (H) 03/27/2018   HDL 55.50 03/27/2018   LDLCALC 190 (H) 03/27/2018   LDLDIRECT 199.2 02/23/2013   TRIG 127.0 03/27/2018   CHOLHDL 5 03/27/2018   Lab Results  Component Value Date   WBC 6.9 03/27/2018   HGB 12.5 03/27/2018   HCT 37.9 03/27/2018   MCV 85.8 03/27/2018   PLT 201.0 03/27/2018   Patient would like to hold on medications. She has already started working on diet and exercise. Down a few pounds.   Health Maintenance Due  Topic Date Due  . FOOT EXAM  05/02/1956  . TETANUS/TDAP  05/02/1965  . DEXA SCAN  05/03/2011  . PNA vac Low Risk Adult (1 of 2 - PCV13) 05/03/2011  . INFLUENZA VACCINE  10/06/2017   Depression screen [facility] 2/9 04/11/2018 03/27/2018 12/31/2013  Decreased Interest 0 0 0  Down, Depressed, Hopeless 1 1 0  PHQ - 2 Score 1 1 0  Altered sleeping 0 0 -  Tired, decreased energy 1 1 -  Change in appetite 1 2 -  Feeling bad or failure about yourself  0 0 -  Trouble concentrating 0 0 -  Moving slowly or fidgety/restless 0 0 -  Suicidal thoughts 0 0 -  PHQ-9 Score 3 4 -  Difficult doing work/chores Not difficult at all Somewhat difficult -   PMHx, SurgHx, SocialHx, FamHx, Medications, and Allergies were reviewed in the Visit Navigator and updated as appropriate.   Patient Active Problem List   Diagnosis Date Noted  . Vitamin D deficiency 03/29/2018  . Hyperlipidemia associated with type 2 diabetes mellitus ([place]) 03/29/2018  . Hypertension associated with diabetes ([place]) 03/29/2018  . Type 2 diabetes mellitus with hyperglycemia, without long-term current use of insulin ([place]) 03/29/2018  . Family history of breast cancer in first degree relative, sister 03/29/2018  . Family history of leukemia, sister  03/29/2018  . Bilateral hearing loss 03/29/2018  . Lipoma of torso 03/29/2018  . Tinnitus of left ear 03/29/2018  . History of hysterectomy 03/29/2018  . Morbid obesity ([place]) 03/29/2018  . ASCVD (arteriosclerotic cardiovascular disease) high risk 03/29/2018  . Refusal of blood transfusions as patient is Jehovah's Witness 12/04/2014   Social History   Tobacco Use  . Smoking status: Former Smoker    Last attempt to quit: 06/15/1975    Years since quitting: 42.8  . Smokeless tobacco: Never Used  Substance Use Topics  . Alcohol use: Yes    Alcohol/week: 0.0 standard drinks    Comment: occasionally Wine, liqour and beer once a month  . Drug use: No   Current Medications and Allergies   .  aspirin EC 81 MG tablet, Take 81 mg by mouth., Disp: , Rfl:  .  atenolol (TENORMIN) 25 MG tablet, Take 25 mg by mouth daily., Disp: , Rfl:  .  Cyanocobalamin (B-12 PO), Take by mouth., Disp: , Rfl:    Allergies  Allergen Reactions  . Atorvastatin Other (See Comments)    Muscle aches  . Hydrocodone-Acetaminophen Other (See Comments)   Review of Systems   Pertinent items are noted in the HPI. Otherwise, a complete ROS is negative.  Vitals   Vitals:   04/11/18 1017  BP: 118/90  Pulse: 72  Temp: 97.8 F (36.6 C)  TempSrc: Oral  SpO2: 97%  Weight: 236 lb (107 kg)  Height: 5' 8.5" (1.74 m)     Body mass index is 35.36 kg/m.  Physical Exam   Physical Exam Constitutional:      General: She is not in acute distress.    Appearance: She is well-developed.  HENT:     Head: Normocephalic and atraumatic.     Right Ear: External ear normal.     Left Ear: External ear normal.     Mouth/Throat:     Pharynx: No oropharyngeal exudate.  Eyes:     Conjunctiva/sclera: Conjunctivae normal.     Pupils: Pupils are equal, round, and reactive to light.  Neck:     Musculoskeletal: Normal range of motion and neck supple.     Thyroid: No thyromegaly.     Vascular: No JVD.  Cardiovascular:      Rate and Rhythm: Normal rate and regular rhythm.     Heart sounds: Normal heart sounds. No murmur. No friction rub. No gallop.   Pulmonary:     Effort: Pulmonary effort is normal.     Breath sounds: Normal breath sounds. No wheezing.  Chest:     Chest wall: No tenderness.  Abdominal:     General: Bowel sounds are normal.     Palpations: Abdomen is soft. There is no mass.     Tenderness: There is no abdominal tenderness. There is no guarding or rebound.  Musculoskeletal: Normal range of motion.  Lymphadenopathy:     Cervical: No cervical adenopathy.  Skin:    Findings: No rash.  Neurological:     Mental Status: She is alert and oriented to person, place, and time.     Deep Tendon Reflexes: Reflexes are normal and symmetric.  Psychiatric:        Behavior: Behavior normal.        Thought Content: Thought content normal.        Judgment: Judgment normal.    Assessment and Plan   Vicki was seen today for f/u labs.  Lab results reviewed with patient, repeat labs ordered prior to next appointment, reviewed compliance with lifestyle measures, reviewed diet, exercise and weight control, herbal and alternative therapies discussed with patient.  Diagnoses and all orders for this visit:  Hyperlipidemia associated with type 2 diabetes mellitus ([place]) -     Comprehensive metabolic panel; Future -     Lipid panel; Future  Hypertension associated with diabetes ([place])  Morbid obesity ([place])  Type 2 diabetes mellitus with hyperglycemia, without long-term current use of insulin ([facility]) -     CBC with Differential/Platelet; Future -     Hemoglobin A1c; Future   . Orders and follow up as documented in [place], reviewed diet, exercise and weight control, cardiovascular risk and specific lipid/LDL goals reviewed, reviewed medications and side effects in detail.  . Reviewed expectations re: course of current medical issues. . Outlined signs and symptoms indicating need for more acute  intervention. . Patient verbalized understanding and all questions were answered. . Patient received an After Visit Summary.  Briscoe Deutscher, DO [place] [place] 04/14/2018  Records requested if needed. Time spent with the patient: 25 minutes, of which >50% was spent in obtaining information about her symptoms, reviewing her previous labs, evaluations, and treatments, counseling her about her condition (please see the discussed topics above), and developing a plan to further investigate it; she had a number  of questions which I addressed.

## 2018-04-14 ENCOUNTER — Encounter: Payer: Self-pay | Admitting: Family Medicine

## 2018-06-26 ENCOUNTER — Ambulatory Visit: Payer: Medicare Other | Admitting: Family Medicine

## 2018-06-27 ENCOUNTER — Other Ambulatory Visit (INDEPENDENT_AMBULATORY_CARE_PROVIDER_SITE_OTHER): Payer: Medicare Other

## 2018-06-27 ENCOUNTER — Other Ambulatory Visit: Payer: Self-pay

## 2018-06-27 DIAGNOSIS — E1165 Type 2 diabetes mellitus with hyperglycemia: Secondary | ICD-10-CM

## 2018-06-27 DIAGNOSIS — E1169 Type 2 diabetes mellitus with other specified complication: Secondary | ICD-10-CM | POA: Diagnosis not present

## 2018-06-27 DIAGNOSIS — E785 Hyperlipidemia, unspecified: Secondary | ICD-10-CM

## 2018-06-27 LAB — COMPREHENSIVE METABOLIC PANEL
ALT: 15 U/L (ref 0–35)
AST: 19 U/L (ref 0–37)
Albumin: 4 g/dL (ref 3.5–5.2)
Alkaline Phosphatase: 77 U/L (ref 39–117)
BUN: 12 mg/dL (ref 6–23)
CO2: 29 mEq/L (ref 19–32)
Calcium: 9.5 mg/dL (ref 8.4–10.5)
Chloride: 103 mEq/L (ref 96–112)
Creatinine, Ser: 0.73 mg/dL (ref 0.40–1.20)
GFR: 94.78 mL/min (ref >60.00)
Glucose, Bld: 146 mg/dL — ABNORMAL HIGH (ref 70–99)
Potassium: 4.1 mEq/L (ref 3.5–5.1)
Sodium: 139 mEq/L (ref 135–145)
Total Bilirubin: 0.4 mg/dL (ref 0.2–1.2)
Total Protein: 7.2 g/dL (ref 6.0–8.3)

## 2018-06-27 LAB — CBC WITH DIFFERENTIAL/PLATELET
Basophils Absolute: 0 10*3/uL (ref 0.0–0.1)
Basophils Relative: 0.7 % (ref 0.0–3.0)
Eosinophils Absolute: 0.1 10*3/uL (ref 0.0–0.7)
Eosinophils Relative: 2.1 % (ref 0.0–5.0)
HCT: 38.5 % (ref 36.0–46.0)
Hemoglobin: 13 g/dL (ref 12.0–15.0)
Lymphocytes Relative: 29 % (ref 12.0–46.0)
Lymphs Abs: 1.8 10*3/uL (ref 0.7–4.0)
MCHC: 33.8 g/dL (ref 30.0–36.0)
MCV: 85.8 fl (ref 78.0–100.0)
Monocytes Absolute: 0.6 10*3/uL (ref 0.1–1.0)
Monocytes Relative: 9.1 % (ref 3.0–12.0)
Neutro Abs: 3.6 10*3/uL (ref 1.4–7.7)
Neutrophils Relative %: 59.1 % (ref 43.0–77.0)
Platelets: 180 10*3/uL (ref 150.0–400.0)
RBC: 4.49 Mil/uL (ref 3.87–5.11)
RDW: 13.7 % (ref 11.5–15.5)
WBC: 6.1 10*3/uL (ref 4.0–10.5)

## 2018-06-27 LAB — LIPID PANEL
Cholesterol: 293 mg/dL — ABNORMAL HIGH (ref 0–200)
HDL: 59 mg/dL (ref >39.00)
LDL Cholesterol: 212 mg/dL — ABNORMAL HIGH (ref 0–99)
NonHDL: 234.24
Total CHOL/HDL Ratio: 5
Triglycerides: 110 mg/dL (ref 0.0–149.0)
VLDL: 22 mg/dL (ref 0.0–40.0)

## 2018-06-27 LAB — HEMOGLOBIN A1C: Hgb A1c MFr Bld: 7.6 % — ABNORMAL HIGH (ref 4.6–6.5)

## 2018-07-04 ENCOUNTER — Encounter: Payer: Self-pay | Admitting: Family Medicine

## 2018-07-04 ENCOUNTER — Other Ambulatory Visit: Payer: Self-pay

## 2018-07-04 ENCOUNTER — Ambulatory Visit (INDEPENDENT_AMBULATORY_CARE_PROVIDER_SITE_OTHER): Payer: Medicare Other | Admitting: Family Medicine

## 2018-07-04 VITALS — BP 130/69 | Temp 98.6°F | Ht 68.5 in | Wt 236.0 lb

## 2018-07-04 DIAGNOSIS — E785 Hyperlipidemia, unspecified: Secondary | ICD-10-CM

## 2018-07-04 DIAGNOSIS — Z7189 Other specified counseling: Secondary | ICD-10-CM | POA: Diagnosis not present

## 2018-07-04 DIAGNOSIS — E1169 Type 2 diabetes mellitus with other specified complication: Secondary | ICD-10-CM

## 2018-07-04 DIAGNOSIS — I152 Hypertension secondary to endocrine disorders: Secondary | ICD-10-CM

## 2018-07-04 DIAGNOSIS — E1165 Type 2 diabetes mellitus with hyperglycemia: Secondary | ICD-10-CM | POA: Diagnosis not present

## 2018-07-04 DIAGNOSIS — E1159 Type 2 diabetes mellitus with other circulatory complications: Secondary | ICD-10-CM

## 2018-07-04 DIAGNOSIS — I2510 Atherosclerotic heart disease of native coronary artery without angina pectoris: Secondary | ICD-10-CM

## 2018-07-04 DIAGNOSIS — I10 Essential (primary) hypertension: Secondary | ICD-10-CM

## 2018-07-04 MED ORDER — ROSUVASTATIN CALCIUM 5 MG PO TABS: 5.0000 mg | ORAL_TABLET | Freq: Every day | ORAL | 0 refills | Status: DC

## 2018-07-04 NOTE — Progress Notes (Signed)
Virtual Visit via Video   Due to the COVID-19 pandemic, this visit was completed with telemedicine (audio/video) technology to reduce patient and provider exposure as well as to preserve personal protective equipment.   I connected with Shelly Yoder on 07/04/18 at  9:40 AM EDT by a video enabled telemedicine application and verified that I am speaking with the correct person using two identifiers. Location patient: Home Location provider:  [facility], Office Persons participating in the virtual visit: [place], [place], DO Lonell Grandchild, CMA acting as scribe for Dr. Briscoe Deutscher.   I discussed the limitations of evaluation and management by telemedicine and the availability of in person appointments. The patient expressed understanding and agreed to proceed.  Care Team   Patient Care Team: Briscoe Deutscher, DO as PCP - General (Family Medicine) Ladene Artist, MD as Consulting Physician (Gastroenterology)  Subjective:   HPI: Patient in office to follow up on lab work had done last week. She has stopped taking her blood pressure medications. She has been checking her readings and last one ahs been around 130/69.  Blood sugars have been doing ok. She knows that when she is very diligent with her diet it is much better. She has readings in the mid 90's when she follows diet well. She did have several snacks last night and her fasting was 130 this am.   Cholesterol: her numbers are a little high from lab work. She has taken medications  in the past but had muscle aches. She is aware that she needs to improve her numbers. She does  want to start medications today.   Review of Systems  Constitutional: Negative for chills and fever.  HENT: Negative for hearing loss.   Eyes: Negative for blurred vision and double vision.  Respiratory: Negative for cough.   Cardiovascular: Negative for chest pain and palpitations.  Gastrointestinal: Negative for heartburn and  nausea.  Genitourinary: Negative for dysuria and urgency.  Skin: Negative for rash.  Neurological: Negative for headaches.  Endo/Heme/Allergies: Does not bruise/bleed easily.  Psychiatric/Behavioral: Negative for depression.    Patient Active Problem List   Diagnosis Date Noted  . Vitamin D deficiency 03/29/2018  . Hyperlipidemia associated with type 2 diabetes mellitus ([place]) 03/29/2018  . Hypertension associated with diabetes ([place]) 03/29/2018  . Type 2 diabetes mellitus with hyperglycemia, without long-term current use of insulin ([place]) 03/29/2018  . Family history of breast cancer in first degree relative, sister 03/29/2018  . Family history of leukemia, sister 03/29/2018  . Bilateral hearing loss 03/29/2018  . Lipoma of torso 03/29/2018  . Tinnitus of left ear 03/29/2018  . History of hysterectomy 03/29/2018  . Morbid obesity ([place]) 03/29/2018  . ASCVD (arteriosclerotic cardiovascular disease) high risk 03/29/2018  . Refusal of blood transfusions as patient is Jehovah's Witness 12/04/2014    Social History   Tobacco Use  . Smoking status: Former Smoker    Last attempt to quit: 06/15/1975    Years since quitting: 43.0  . Smokeless tobacco: Never Used  Substance Use Topics  . Alcohol use: Yes    Alcohol/week: 0.0 standard drinks    Comment: occasionally Wine, liqour and beer once a month    Current Outpatient Medications:  .  aspirin EC 81 MG tablet, Take 81 mg by mouth., Disp: , Rfl:  .  Cyanocobalamin (B-12 PO), Take by mouth., Disp: , Rfl:  .  Multiple Vitamin (MULTI-VITAMINS) TABS, Take by mouth., Disp: , Rfl:  .  rosuvastatin (CRESTOR) 5  MG tablet, Take 1 tablet (5 mg total) by mouth daily., Disp: 90 tablet, Rfl: 0  Allergies  Allergen Reactions  . Atorvastatin Other (See Comments)    Muscle aches  . Hydrocodone-Acetaminophen Other (See Comments)    Objective:   VITALS: Per patient if applicable, see vitals. GENERAL: Alert, appears well and in no acute  distress. HEENT: Atraumatic, conjunctiva clear, no obvious abnormalities on inspection of external nose and ears. NECK: Normal movements of the head and neck. CARDIOPULMONARY: No increased WOB. Speaking in clear sentences. I:E ratio WNL.  MS: Moves all visible extremities without noticeable abnormality. PSYCH: Pleasant and cooperative, well-groomed. Speech normal rate and rhythm. Affect is appropriate. Insight and judgement are appropriate. Attention is focused, linear, and appropriate.  NEURO: CN grossly intact. Oriented as arrived to appointment on time with no prompting. Moves both UE equally.  SKIN: No obvious lesions, wounds, erythema, or cyanosis noted on face or hands.  Depression screen [facility] 2/9 04/11/2018 03/27/2018 12/31/2013  Decreased Interest 0 0 0  Down, Depressed, Hopeless 1 1 0  PHQ - 2 Score 1 1 0  Altered sleeping 0 0 -  Tired, decreased energy 1 1 -  Change in appetite 1 2 -  Feeling bad or failure about yourself  0 0 -  Trouble concentrating 0 0 -  Moving slowly or fidgety/restless 0 0 -  Suicidal thoughts 0 0 -  PHQ-9 Score 3 4 -  Difficult doing work/chores Not difficult at all Somewhat difficult -    Assessment and Plan:   Kairee was seen today for follow-up.  Diagnoses and all orders for this visit:  Hyperlipidemia associated with type 2 diabetes mellitus ([place]) Comments: Okay to start medication. Low dose to monitor myalgias. Orders: -     rosuvastatin (CRESTOR) 5 MG tablet; Take 1 tablet (5 mg total) by mouth daily. -     Lipid panel; Future  Type 2 diabetes mellitus with hyperglycemia, without long-term current use of insulin ([place]) Comments: Patient declines medication. A1c < 8. Okay to continue diet control.  Orders: -     Comprehensive metabolic panel; Future -     Hemoglobin A1c; Future  Hypertension associated with diabetes ([place]) Comments: Controlled. Continue current treatment.   ASCVD (arteriosclerotic cardiovascular disease) high  risk  Educated About Covid-19 Virus Infection   . COVID-19 Education:The signs and symptoms of COVID-19 were discussed with the patient and how to seek care for testing if needed. The importance of social distancing was discussed today. . Reviewed expectations re: course of current medical issues. . Discussed self-management of symptoms. . Outlined signs and symptoms indicating need for more acute intervention. . Patient verbalized understanding and all questions were answered. Marland Kitchen Health Maintenance issues including appropriate healthy diet, exercise, and smoking avoidance were discussed with patient. . See orders for this visit as documented in the electronic medical record.  Briscoe Deutscher, DO 07/04/2018  Records requested if needed. Time spent: 25 minutes, of which >50% was spent in obtaining information about her symptoms, reviewing her previous labs, evaluations, and treatments, counseling her about her condition (please see the discussed topics above), and developing a plan to further investigate it; she had a number of questions which I addressed.

## 2018-07-17 ENCOUNTER — Encounter: Payer: Self-pay | Admitting: Gastroenterology

## 2018-08-15 ENCOUNTER — Other Ambulatory Visit: Payer: Self-pay

## 2018-08-15 ENCOUNTER — Ambulatory Visit (AMBULATORY_SURGERY_CENTER): Payer: Self-pay | Admitting: *Deleted

## 2018-08-15 VITALS — Ht 69.0 in | Wt 229.0 lb

## 2018-08-15 DIAGNOSIS — Z86010 Personal history of colonic polyps: Secondary | ICD-10-CM

## 2018-08-15 MED ORDER — NA SULFATE-K SULFATE-MG SULF 17.5-3.13-1.6 GM/177ML PO SOLN: 1.0000 | Freq: Once | ORAL | 0 refills | Status: AC

## 2018-08-15 NOTE — Progress Notes (Signed)
No egg or soy allergy known to patient  No issues with past sedation with any surgeries  or procedures, no intubation problems  No diet pills per patient No home 02 use per patient  No blood thinners per patient  Pt denies issues with constipation  No A fib or A flutter  EMMI video sent to pt's e mail    Pt verified name, DOB, address and insurance during PV today. Pt mailed instruction packet to included paper to complete and mail back to [facility] with addressed and stamped envelope, Emmi video, copy of consent form to read and not return, and instructions. PV completed over the phone. Pt encouraged to call with questions or issues   Pt is aware that care partner will wait in the car during parking lot; if they feel like they will be too hot to wait in the car; they may wait in the lobby.  We want them to wear a mask (we do not have any that we can provide them), practice social distancing, and we will check their temperatures when they get here.  I did remind patient that their care partner needs to stay in the parking lot the entire time. Pt will wear mask into building

## 2018-08-29 ENCOUNTER — Encounter: Payer: Medicare Other | Admitting: Gastroenterology

## 2018-09-13 DIAGNOSIS — L508 Other urticaria: Secondary | ICD-10-CM | POA: Diagnosis not present

## 2018-09-26 ENCOUNTER — Encounter: Payer: Self-pay | Admitting: Family Medicine

## 2018-10-01 ENCOUNTER — Other Ambulatory Visit: Payer: Self-pay | Admitting: Family Medicine

## 2018-10-01 DIAGNOSIS — E1169 Type 2 diabetes mellitus with other specified complication: Secondary | ICD-10-CM

## 2018-10-01 DIAGNOSIS — E785 Hyperlipidemia, unspecified: Secondary | ICD-10-CM

## 2018-10-03 ENCOUNTER — Ambulatory Visit: Payer: Medicare Other | Admitting: Family Medicine

## 2018-10-03 NOTE — Progress Notes (Deleted)
Virtual Visit via Video   Due to the COVID-19 pandemic, this visit was completed with telemedicine (audio/video) technology to reduce patient and provider exposure as well as to preserve personal protective equipment.   I connected with Shelly Yoder by a video enabled telemedicine application and verified that I am speaking with the correct person using two identifiers. Location patient: Home Location provider: [facility], Office Persons participating in the virtual visit: Shelly Yoder, [place], DO   I discussed the limitations of evaluation and management by telemedicine and the availability of in person appointments. The patient expressed understanding and agreed to proceed.  Care Team   Patient Care Team: Briscoe Deutscher, DO as PCP - General (Family Medicine) Ladene Artist, MD as Consulting Physician (Gastroenterology)  Subjective:   HPI:   ROS   Patient Active Problem List   Diagnosis Date Noted  . Vitamin D deficiency 03/29/2018  . Hyperlipidemia associated with type 2 diabetes mellitus ([place]) 03/29/2018  . Hypertension associated with diabetes ([place]) 03/29/2018  . Type 2 diabetes mellitus with hyperglycemia, without long-term current use of insulin ([place]) 03/29/2018  . Family history of breast cancer in first degree relative, sister 03/29/2018  . Family history of leukemia, sister 03/29/2018  . Bilateral hearing loss 03/29/2018  . Lipoma of torso 03/29/2018  . Tinnitus of left ear 03/29/2018  . History of hysterectomy 03/29/2018  . Morbid obesity ([place]) 03/29/2018  . ASCVD (arteriosclerotic cardiovascular disease) high risk 03/29/2018  . Refusal of blood transfusions as patient is Jehovah's Witness 12/04/2014    Social History   Tobacco Use  . Smoking status: Former Smoker    Quit date: 06/15/1975    Years since quitting: 43.3  . Smokeless tobacco: Never Used  Substance Use Topics  . Alcohol use: Yes    Alcohol/week: 0.0 standard drinks   Comment: occasionally Wine, liqour and beer once a month    Current Outpatient Medications:  .  aspirin EC 81 MG tablet, Take 81 mg by mouth., Disp: , Rfl:  .  Cyanocobalamin (B-12 PO), Take by mouth., Disp: , Rfl:  .  glucose blood test strip, , Disp: , Rfl:  .  Multiple Vitamin (MULTI-VITAMINS) TABS, Take by mouth., Disp: , Rfl:  .  rosuvastatin (CRESTOR) 5 MG tablet, TAKE ONE TABLET BY MOUTH DAILY, Disp: 90 tablet, Rfl: 0  Allergies  Allergen Reactions  . Atorvastatin Other (See Comments)    Muscle aches  . Hydrocodone-Acetaminophen Other (See Comments)    Objective:   VITALS: Per patient if applicable, see vitals. GENERAL: Alert, appears well and in no acute distress. HEENT: Atraumatic, conjunctiva clear, no obvious abnormalities on inspection of external nose and ears. NECK: Normal movements of the head and neck. CARDIOPULMONARY: No increased WOB. Speaking in clear sentences. I:E ratio WNL.  MS: Moves all visible extremities without noticeable abnormality. PSYCH: Pleasant and cooperative, well-groomed. Speech normal rate and rhythm. Affect is appropriate. Insight and judgement are appropriate. Attention is focused, linear, and appropriate.  NEURO: CN grossly intact. Oriented as arrived to appointment on time with no prompting. Moves both UE equally.  SKIN: No obvious lesions, wounds, erythema, or cyanosis noted on face or hands.  Depression screen [facility] 2/9 04/11/2018 03/27/2018 12/31/2013  Decreased Interest 0 0 0  Down, Depressed, Hopeless 1 1 0  PHQ - 2 Score 1 1 0  Altered sleeping 0 0 -  Tired, decreased energy 1 1 -  Change in appetite 1 2 -  Feeling bad or  failure about yourself  0 0 -  Trouble concentrating 0 0 -  Moving slowly or fidgety/restless 0 0 -  Suicidal thoughts 0 0 -  PHQ-9 Score 3 4 -  Difficult doing work/chores Not difficult at all Somewhat difficult -    Assessment and Plan:   There are no diagnoses linked to this encounter.  Marland Kitchen COVID-19  Education: The signs and symptoms of COVID-19 were discussed with the patient and how to seek care for testing if needed. The importance of social distancing was discussed today. . Reviewed expectations re: course of current medical issues. . Discussed self-management of symptoms. . Outlined signs and symptoms indicating need for more acute intervention. . Patient verbalized understanding and all questions were answered. Marland Kitchen Health Maintenance issues including appropriate healthy diet, exercise, and smoking avoidance were discussed with patient. . See orders for this visit as documented in the electronic medical record.  Briscoe Deutscher, DO  Records requested if needed. Time spent: *** minutes, of which >50% was spent in obtaining information about her symptoms, reviewing her previous labs, evaluations, and treatments, counseling her about her condition (please see the discussed topics above), and developing a plan to further investigate it; she had a number of questions which I addressed.

## 2018-10-05 ENCOUNTER — Other Ambulatory Visit: Payer: Self-pay

## 2018-10-05 ENCOUNTER — Other Ambulatory Visit (INDEPENDENT_AMBULATORY_CARE_PROVIDER_SITE_OTHER): Payer: Medicare Other

## 2018-10-05 DIAGNOSIS — E1169 Type 2 diabetes mellitus with other specified complication: Secondary | ICD-10-CM

## 2018-10-05 DIAGNOSIS — E1165 Type 2 diabetes mellitus with hyperglycemia: Secondary | ICD-10-CM

## 2018-10-05 DIAGNOSIS — E785 Hyperlipidemia, unspecified: Secondary | ICD-10-CM

## 2018-10-05 LAB — COMPREHENSIVE METABOLIC PANEL
ALT: 14 U/L (ref 0–35)
AST: 21 U/L (ref 0–37)
Albumin: 4.1 g/dL (ref 3.5–5.2)
Alkaline Phosphatase: 68 U/L (ref 39–117)
BUN: 13 mg/dL (ref 6–23)
CO2: 26 mEq/L (ref 19–32)
Calcium: 9.5 mg/dL (ref 8.4–10.5)
Chloride: 104 mEq/L (ref 96–112)
Creatinine, Ser: 0.96 mg/dL (ref 0.40–1.20)
GFR: 69.04 mL/min (ref >60.00)
Glucose, Bld: 149 mg/dL — ABNORMAL HIGH (ref 70–99)
Potassium: 4 mEq/L (ref 3.5–5.1)
Sodium: 140 mEq/L (ref 135–145)
Total Bilirubin: 0.4 mg/dL (ref 0.2–1.2)
Total Protein: 7.3 g/dL (ref 6.0–8.3)

## 2018-10-05 LAB — LIPID PANEL
Cholesterol: 306 mg/dL — ABNORMAL HIGH (ref 0–200)
HDL: 61 mg/dL (ref >39.00)
LDL Cholesterol: 221 mg/dL — ABNORMAL HIGH (ref 0–99)
NonHDL: 244.57
Total CHOL/HDL Ratio: 5
Triglycerides: 116 mg/dL (ref 0.0–149.0)
VLDL: 23.2 mg/dL (ref 0.0–40.0)

## 2018-10-05 LAB — HEMOGLOBIN A1C: Hgb A1c MFr Bld: 7.3 % — ABNORMAL HIGH (ref 4.6–6.5)

## 2018-10-10 ENCOUNTER — Encounter: Payer: Self-pay | Admitting: Family Medicine

## 2018-10-10 ENCOUNTER — Ambulatory Visit (INDEPENDENT_AMBULATORY_CARE_PROVIDER_SITE_OTHER): Payer: Medicare Other | Admitting: Family Medicine

## 2018-10-10 DIAGNOSIS — E1169 Type 2 diabetes mellitus with other specified complication: Secondary | ICD-10-CM | POA: Diagnosis not present

## 2018-10-10 DIAGNOSIS — E1165 Type 2 diabetes mellitus with hyperglycemia: Secondary | ICD-10-CM

## 2018-10-10 DIAGNOSIS — F4321 Adjustment disorder with depressed mood: Secondary | ICD-10-CM

## 2018-10-10 DIAGNOSIS — I2510 Atherosclerotic heart disease of native coronary artery without angina pectoris: Secondary | ICD-10-CM

## 2018-10-10 DIAGNOSIS — E785 Hyperlipidemia, unspecified: Secondary | ICD-10-CM

## 2018-10-10 DIAGNOSIS — E1159 Type 2 diabetes mellitus with other circulatory complications: Secondary | ICD-10-CM

## 2018-10-10 DIAGNOSIS — I152 Hypertension secondary to endocrine disorders: Secondary | ICD-10-CM

## 2018-10-10 DIAGNOSIS — I10 Essential (primary) hypertension: Secondary | ICD-10-CM

## 2018-10-10 NOTE — Progress Notes (Signed)
Virtual Visit via Video   Due to the COVID-19 pandemic, this visit was completed with telemedicine (audio/video) technology to reduce patient and provider exposure as well as to preserve personal protective equipment.   I connected with Shelly Yoder by a video enabled telemedicine application and verified that I am speaking with the correct person using two identifiers. Location patient: Home Location provider: [facility], Office Persons participating in the virtual visit: Cason, Dabney, DO   I discussed the limitations of evaluation and management by telemedicine and the availability of in person appointments. The patient expressed understanding and agreed to proceed.  Care Team   Patient Care Team: Briscoe Deutscher, DO as PCP - General (Family Medicine) Ladene Artist, MD as Consulting Physician (Gastroenterology)  Subjective:   HPI:   1. Hyperlipidemia associated with type 2 diabetes mellitus (HCC) Lab Results  Component Value Date   CHOL 306 (H) 10/05/2018   HDL 61.00 10/05/2018   LDLCALC 221 (H) 10/05/2018   LDLDIRECT 199.2 02/23/2013   TRIG 116.0 10/05/2018   CHOLHDL 5 10/05/2018   Lab Results  Component Value Date   ALT 14 10/05/2018   AST 21 10/05/2018   ALKPHOS 68 10/05/2018   BILITOT 0.4 10/05/2018   2. Hypertension associated with diabetes ([place]) BP Readings from Last 3 Encounters:  07/04/18 130/69  04/11/18 118/90  03/27/18 (!) 160/80   Lab Results  Component Value Date   CREATININE 0.96 10/05/2018   3. Type 2 diabetes mellitus with hyperglycemia, without long-term current use of insulin (HCC) Lab Results  Component Value Date   HGBA1C 7.3 (H) 10/05/2018   4. Morbid obesity ([place]) Wt Readings from Last 3 Encounters:  08/15/18 229 lb (103.9 kg)  07/04/18 236 lb (107 kg)  04/11/18 236 lb (107 kg)   5. ASCVD (arteriosclerotic cardiovascular disease) high risk The 10-year ASCVD risk score Mikey Bussing DC Jr., et al., 2013) is:  42.1%   Values used to calculate the score:     Age: 20 years     Sex: Female     Is Non-Hispanic African American: Yes     Diabetic: Yes     Tobacco smoker: No     Systolic Blood Pressure: 680 mmHg     Is BP treated: No     HDL Cholesterol: 61 mg/dL     Total Cholesterol: 306 mg/dL   Review of Systems  Constitutional: Negative for chills, fever, malaise/fatigue and weight loss.  Respiratory: Negative for cough, shortness of breath and wheezing.   Cardiovascular: Negative for chest pain, palpitations and leg swelling.  Gastrointestinal: Negative for abdominal pain, constipation, diarrhea, nausea and vomiting.  Genitourinary: Negative for dysuria and urgency.  Musculoskeletal: Negative for joint pain and myalgias.  Skin: Negative for rash.  Neurological: Negative for dizziness and headaches.  Psychiatric/Behavioral: Negative for depression, substance abuse and suicidal ideas. The patient is not nervous/anxious.     Patient Active Problem List   Diagnosis Date Noted  . Vitamin D deficiency 03/29/2018  . Hyperlipidemia associated with type 2 diabetes mellitus ([place]) 03/29/2018  . Hypertension associated with diabetes ([place]) 03/29/2018  . Type 2 diabetes mellitus with hyperglycemia, without long-term current use of insulin ([place]) 03/29/2018  . Family history of breast cancer in first degree relative, sister 03/29/2018  . Family history of leukemia, sister 03/29/2018  . Bilateral hearing loss 03/29/2018  . Lipoma of torso 03/29/2018  . Tinnitus of left ear 03/29/2018  . History of hysterectomy 03/29/2018  .  Morbid obesity ([place]) 03/29/2018  . ASCVD (arteriosclerotic cardiovascular disease) high risk 03/29/2018  . Refusal of blood transfusions as patient is Jehovah's Witness 12/04/2014    Social History   Tobacco Use  . Smoking status: Former Smoker    Quit date: 06/15/1975    Years since quitting: 43.3  . Smokeless tobacco: Never Used  Substance Use Topics  . Alcohol use: Yes     Alcohol/week: 0.0 standard drinks    Comment: occasionally Wine, liqour and beer once a month   Current Outpatient Medications:  .  aspirin EC 81 MG tablet, Take 81 mg by mouth., Disp: , Rfl:  .  cimetidine (TAGAMET) 400 MG tablet, Take 1 tablet by mouth daily., Disp: , Rfl:  .  Cyanocobalamin (B-12 PO), Take by mouth., Disp: , Rfl:  .  glucose blood test strip, , Disp: , Rfl:  .  Multiple Vitamin (MULTI-VITAMINS) TABS, Take by mouth., Disp: , Rfl:  .  rosuvastatin (CRESTOR) 5 MG tablet, TAKE ONE TABLET BY MOUTH DAILY, Disp: 90 tablet, Rfl: 0 .  triamcinolone cream (KENALOG) 0.1 %, Apply to affected area BID prn, Disp: , Rfl:   Allergies  Allergen Reactions  . Atorvastatin Other (See Comments)    Muscle aches  . Hydrocodone-Acetaminophen Other (See Comments)   Objective:   VITALS: Per patient if applicable, see vitals. GENERAL: Alert and in no acute distress. CARDIOPULMONARY: No increased WOB. Speaking in clear sentences.  PSYCH: Pleasant and cooperative. Speech normal rate and rhythm. Affect is appropriate. Insight and judgement are appropriate. Attention is focused, linear, and appropriate.  NEURO: Oriented as arrived to appointment on time with no prompting.   Depression screen [facility] 2/9 04/11/2018 03/27/2018 12/31/2013  Decreased Interest 0 0 0  Down, Depressed, Hopeless 1 1 0  PHQ - 2 Score 1 1 0  Altered sleeping 0 0 -  Tired, decreased energy 1 1 -  Change in appetite 1 2 -  Feeling bad or failure about yourself  0 0 -  Trouble concentrating 0 0 -  Moving slowly or fidgety/restless 0 0 -  Suicidal thoughts 0 0 -  PHQ-9 Score 3 4 -  Difficult doing work/chores Not difficult at all Somewhat difficult -   Assessment and Plan:   Diagnoses and all orders for this visit:  Type 2 diabetes mellitus with hyperglycemia, without long-term current use of insulin ([place]) Comments: Stable.  Patient hesitates to take any medication and A1c is under 8 at this time.  Discussed diet  choices and exercise today.  Hyperlipidemia associated with type 2 diabetes mellitus ([place]) Comments: Worsened significantly.  Patient had rash with Zocor in the past.  Followed up with dermatology and prescribed a cream.  Hypertension associated with diabetes ([place]) Comments: Stable.  Continue current treatment.  Morbid obesity ([place])  ASCVD (arteriosclerotic cardiovascular disease) high risk Comments: 48% today.  She is going to start Crestor.  She is aware that it will likely need to be increased.  Grief Comments: Unfortunately, the patient's grandson was murdered 3 months ago.  He was 72 years old.  She and the family struggling due to inability to grieve appropriately.   Marland Kitchen COVID-19 Education: The signs and symptoms of COVID-19 were discussed with the patient and how to seek care for testing if needed. The importance of social distancing was discussed today. . Reviewed expectations re: course of current medical issues. . Discussed self-management of symptoms. . Outlined signs and symptoms indicating need for more acute intervention. . Patient verbalized  understanding and all questions were answered. Marland Kitchen Health Maintenance issues including appropriate healthy diet, exercise, and smoking avoidance were discussed with patient. . See orders for this visit as documented in the electronic medical record.  Briscoe Deutscher, DO  Records requested if needed. Time spent: 25 minutes, of which >50% was spent in obtaining information about her symptoms, reviewing her previous labs, evaluations, and treatments, counseling her about her condition (please see the discussed topics above), and developing a plan to further investigate it; she had a number of questions which I addressed.

## 2018-12-06 DIAGNOSIS — H903 Sensorineural hearing loss, bilateral: Secondary | ICD-10-CM | POA: Diagnosis not present

## 2018-12-06 DIAGNOSIS — R42 Dizziness and giddiness: Secondary | ICD-10-CM | POA: Diagnosis not present

## 2019-01-02 ENCOUNTER — Other Ambulatory Visit: Payer: Medicare Other

## 2019-01-09 ENCOUNTER — Ambulatory Visit: Payer: Medicare Other | Admitting: Family Medicine

## 2019-01-09 ENCOUNTER — Telehealth: Payer: Self-pay | Admitting: Family Medicine

## 2019-01-09 NOTE — Telephone Encounter (Signed)
I left a message asking the patient to call and schedule Medicare AWV (initial) with Loma Sousa ([place]).  If patient calls back, please schedule Medicare Wellness Visit at next available opening.  VDM ([place])

## 2019-01-18 ENCOUNTER — Other Ambulatory Visit: Payer: Self-pay

## 2019-01-18 ENCOUNTER — Ambulatory Visit (INDEPENDENT_AMBULATORY_CARE_PROVIDER_SITE_OTHER): Payer: Medicare Other

## 2019-01-18 DIAGNOSIS — Z780 Asymptomatic menopausal state: Secondary | ICD-10-CM

## 2019-01-18 DIAGNOSIS — Z1231 Encounter for screening mammogram for malignant neoplasm of breast: Secondary | ICD-10-CM | POA: Diagnosis not present

## 2019-01-18 DIAGNOSIS — Z0000 Encounter for general adult medical examination without abnormal findings: Secondary | ICD-10-CM

## 2019-01-18 NOTE — Progress Notes (Signed)
I connected with Shelly Yoder on 01/18/19 at 1000 by phone and verified that I am speaking with the correct person using two identifiers. Location patient: Home Location provider:  [facility], Office Persons participating in the virtual visit: Denman George LPN and Dr. Orma Flaming   I discussed the limitations of evaluation and management by telemedicine and the availability of in person appointments. The patient expressed understanding and agreed to proceed.  Subjective:   Shelly Yoder is a 73 y.o. female who presents for Medicare Annual (Subsequent) preventive examination.  Review of Systems:   Cardiac Risk Factors include: advanced age (>54men, >17 women);diabetes mellitus;dyslipidemia    Objective:     Vitals: There were no vitals taken for this visit.  There is no height or weight on file to calculate BMI.  Advanced Directives 01/18/2019 06/12/2015  Does Patient Have a Medical Advance Directive? Yes Yes  Type of Advance Directive Living will;Healthcare Power of [place]  Does patient want to make changes to medical advance directive? No - Patient declined -  Copy of [place] in Chart? No - copy requested -    Tobacco Social History   Tobacco Use  Smoking Status Former Smoker  . Quit date: 06/15/1975  . Years since quitting: 43.6  Smokeless Tobacco Never Used     Counseling given: Not Answered   Clinical Intake:  Pre-visit preparation completed: Yes  Pain : No/denies pain  Diabetes: Yes CBG done?: No Did pt. bring in CBG monitor from home?: No  How often do you need to have someone help you when you read instructions, pamphlets, or other written materials from your doctor or pharmacy?: 1 - Never  Interpreter Needed?: No  Information entered by :: Denman George LPN  Past Medical History:  Diagnosis Date  . Anemia   . Anxiety    situational  . Arthritis    possibly   . Bilateral hearing loss  03/29/2018  . Cataracts, bilateral    forming both eyes  . Colon polyps   . Diabetes mellitus without complication ([place]) [id number]    Diet controlled   . Heart murmur   . Hyperlipidemia   . Hyperlipidemia associated with type 2 diabetes mellitus ([place]) 03/29/2018  . Hypertension   . Hypertension associated with diabetes ([place]) 03/29/2018  . Lipoma   . Lipoma of torso 03/29/2018  . Refusal of blood transfusions as patient is Jehovah's Witness 12/04/2014  . Type 2 diabetes mellitus with hyperglycemia, without long-term current use of insulin ([place]) 03/29/2018  . Vitamin D deficiency 03/29/2018   Past Surgical History:  Procedure Laterality Date  . ABDOMINAL HYSTERECTOMY  1996   partial   . BREAST BIOPSY Right   . COLONOSCOPY    . LIPOMA EXCISION     upper back  . POLYPECTOMY     Family History  Problem Relation Age of Onset  . Diabetes Mother   . Heart attack Mother   . Hyperlipidemia Mother   . Heart disease Mother 19  . Heart block Mother   . Kidney disease Mother   . Alcohol abuse Father   . Diabetes Father   . Heart disease Father   . Kidney disease Father   . Breast cancer Sister   . Diabetes Brother   . Hypertension Brother   . Kidney disease Brother   . Colon cancer Neg Hx   . Esophageal cancer Neg Hx   . Stomach cancer Neg Hx   .  Rectal cancer Neg Hx   . Colon polyps Neg Hx    Social History   Socioeconomic History  . Marital status: Married    Spouse name: Not on file  . Number of children: 3  . Years of education: Not on file  . Highest education level: Not on file  Occupational History  . Occupation: Retired    Comment: Publishing rights manager   Social Needs  . Financial resource strain: Not on file  . Food insecurity    Worry: Not on file    Inability: Not on file  . Transportation needs    Medical: Not on file    Non-medical: Not on file  Tobacco Use  . Smoking status: Former Smoker    Quit date: 06/15/1975    Years since quitting: 43.6  . Smokeless  tobacco: Never Used  Substance and Sexual Activity  . Alcohol use: Yes    Alcohol/week: 0.0 standard drinks    Comment: occasionally Wine, liqour and beer once a month  . Drug use: No  . Sexual activity: Not Currently  Lifestyle  . Physical activity    Days per week: Not on file    Minutes per session: Not on file  . Stress: Not on file  Relationships  . Social Herbalist on phone: Not on file    Gets together: Not on file    Attends religious service: Not on file    Active member of club or organization: Not on file    Attends meetings of clubs or organizations: Not on file    Relationship status: Not on file  Other Topics Concern  . Not on file  Social History Narrative  . Not on file    Outpatient Encounter Medications as of 01/18/2019  Medication Sig  . aspirin EC 81 MG tablet Take 81 mg by mouth.  . cimetidine (TAGAMET) 400 MG tablet Take 1 tablet by mouth daily.  . Cyanocobalamin (B-12 PO) Take by mouth.  Marland Kitchen glucose blood test strip   . Multiple Vitamin (MULTI-VITAMINS) TABS Take by mouth.  . rosuvastatin (CRESTOR) 5 MG tablet TAKE ONE TABLET BY MOUTH DAILY  . triamcinolone cream (KENALOG) 0.1 % Apply to affected area BID prn   No facility-administered encounter medications on file as of 01/18/2019.     Activities of Daily Living In your present state of health, do you have any difficulty performing the following activities: 01/18/2019 03/27/2018  Hearing? Y Y  Comment followed by audiology/ENT -  Vision? N N  Difficulty concentrating or making decisions? N N  Walking or climbing stairs? N Y  Comment - balance issue (vertigo)  Dressing or bathing? N N  Doing errands, shopping? N N  Preparing Food and eating ? N -  Using the Toilet? N -  In the past six months, have you accidently leaked urine? N -  Do you have problems with loss of bowel control? N -  Managing your Medications? N -  Managing your Finances? N -  Housekeeping or managing your  Housekeeping? N -  Some recent data might be hidden    Patient Care Team: Briscoe Deutscher, DO as PCP - General (Family Medicine) Ladene Artist, MD as Consulting Physician (Gastroenterology) Leta Baptist, MD as Consulting Physician (Otolaryngology) [place], P.A. as Consulting Physician Allyn Kenner, MD as Consulting Physician (Dermatology)    Assessment:   This is a routine wellness examination for [facility].  Exercise Activities and Dietary recommendations Current Exercise  Habits: The patient does not participate in regular exercise at present  Goals    . Patient Stated     Maintain current state of health and remain Covid free        Fall Risk Fall Risk  01/18/2019 03/27/2018 12/31/2013  Falls in the past year? 0 0 No  Number falls in past yr: - 0 -  Injury with Fall? 0 0 -  Follow up Falls evaluation completed;Education provided;Falls prevention discussed - -   Is the patient's home free of loose throw rugs in walkways, pet beds, electrical cords, etc?   yes      Grab bars in the bathroom? yes      Handrails on the stairs?   yes      Adequate lighting?   yes  Depression Screen PHQ 2/9 Scores 01/18/2019 04/11/2018 03/27/2018 12/31/2013  PHQ - 2 Score 0 1 1 0  PHQ- 9 Score - 3 4 -     Cognitive Function        There is no immunization history for the selected administration types on file for this patient.  Qualifies for Shingles Vaccine?Discussed and patient will check with pharmacy for coverage.  Patient education handout provided   Screening Tests Health Maintenance  Topic Date Due  . FOOT EXAM  05[birth year]02/1955  . DEXA SCAN  05/03/2011  . COLONOSCOPY  06/12/2018  . INFLUENZA VACCINE  06/06/2019 (Originally 10/07/2018)  . TETANUS/TDAP  01/18/2020 (Originally 05/02/1965)  . PNA vac Low Risk Adult (1 of 2 - PCV13) 01/18/2020 (Originally 05/03/2011)  . OPHTHALMOLOGY EXAM  01/20/2019  . URINE MICROALBUMIN  03/28/2019  . HEMOGLOBIN A1C  04/07/2019  . MAMMOGRAM   03/06/2020  . Hepatitis C Screening  Completed    Cancer Screenings: Lung: Low Dose CT Chest recommended if Age 51-80 years, 30 pack-year currently smoking OR have quit w/in 15years. Patient does not qualify. Breast:  Up to date on Mammogram? Yes; ordered today  Up to date of Bone Density/Dexa? No; ordered today  Colorectal: last colonoscopy 06/12/15 with Dr. Fuller Plan ; repeat in 3 years     Plan:  I have personally reviewed and addressed the Medicare Annual Wellness questionnaire and have noted the following in the patient's chart:  A. Medical and social history B. Use of alcohol, tobacco or illicit drugs  C. Current medications and supplements D. Functional ability and status E.  Nutritional status F.  Physical activity G. Advance directives H. List of other physicians I.  Hospitalizations, surgeries, and ER visits in previous 12 months [place].  [place] such as hearing and vision if needed, cognitive and depression L. Referrals, records requested, and appointments- mammogram and dexa ordered  In addition, I have reviewed and discussed with patient certain preventive protocols, quality metrics, and best practice recommendations. A written personalized care plan for preventive services as well as general preventive health recommendations were provided to patient.   Signed,  Denman George, LPN  Nurse Health Advisor   Nurse Notes: no additional

## 2019-01-18 NOTE — Patient Instructions (Signed)
Shelly Yoder , Thank you for taking time to come for your Medicare Wellness Visit. I appreciate your ongoing commitment to your health goals. Please review the following plan we discussed and let me know if I can assist you in the future.   Screening recommendations/referrals: Colorectal Screening: repeat due; last 06/12/15 with Dr. Fuller Plan (please contact his office to set up) Mammogram: ordered today Bone Density: ordered today  Vision and Dental Exams: Recommended annual ophthalmology exams for early detection of glaucoma and other disorders of the eye Recommended annual dental exams for proper oral hygiene  Diabetic Exams: Diabetic Eye Exam: recommended yearly; up to date with Dr. Katy Fitch Diabetic Foot Exam: recommended yearly; at next office visit   Vaccinations: Influenza vaccine:  recommended this fall either at PCP office or through your local pharmacy  Pneumococcal vaccine: recommended  Tdap vaccine: recommended; Please call your insurance company to determine your out of pocket expense. You may receive this vaccine at your local pharmacy or Health Dept. Shingles vaccine: Please call your insurance company to determine your out of pocket expense for the Shingrix vaccine. You may receive this vaccine at your local pharmacy.  Advanced directives: Please bring a copy of your POA (Power of Attorney) and/or Living Will to your next appointment.  Goals: Recommend to drink at least 6-8 8oz glasses of water per day and consume a balanced diet rich in fresh fruits and vegetables.   Next appointment: Please schedule your Annual Wellness Visit with your Nurse Health Advisor in one year.  Preventive Care 25 Years and Older, Female Preventive care refers to lifestyle choices and visits with your health care provider that can promote health and wellness. What does preventive care include?  A yearly physical exam. This is also called an annual well check.  Dental exams once or twice a year.   Routine eye exams. Ask your health care provider how often you should have your eyes checked.  Personal lifestyle choices, including:  Daily care of your teeth and gums.  Regular physical activity.  Eating a healthy diet.  Avoiding tobacco and drug use.  Limiting alcohol use.  Practicing safe sex.  Taking low-dose aspirin every day if recommended by your health care provider.  Taking vitamin and mineral supplements as recommended by your health care provider. What happens during an annual well check? The services and screenings done by your health care provider during your annual well check will depend on your age, overall health, lifestyle risk factors, and family history of disease. Counseling  Your health care provider may ask you questions about your:  Alcohol use.  Tobacco use.  Drug use.  Emotional well-being.  Home and relationship well-being.  Sexual activity.  Eating habits.  History of falls.  Memory and ability to understand (cognition).  Work and work Statistician.  Reproductive health. Screening  You may have the following tests or measurements:  Height, weight, and BMI.  Blood pressure.  Lipid and cholesterol levels. These may be checked every 5 years, or more frequently if you are over 64 years old.  Skin check.  Lung cancer screening. You may have this screening every year starting at age 65 if you have a 30-pack-year history of smoking and currently smoke or have quit within the past 15 years.  Fecal occult blood test (FOBT) of the stool. You may have this test every year starting at age 46.  Flexible sigmoidoscopy or colonoscopy. You may have a sigmoidoscopy every 5 years or a colonoscopy every 10  years starting at age 57.  Hepatitis C blood test.  Hepatitis B blood test.  Sexually transmitted disease (STD) testing.  Diabetes screening. This is done by checking your blood sugar (glucose) after you have not eaten for a while  (fasting). You may have this done every 1-3 years.  Bone density scan. This is done to screen for osteoporosis. You may have this done starting at age 61.  Mammogram. This may be done every 1-2 years. Talk to your health care provider about how often you should have regular mammograms. Talk with your health care provider about your test results, treatment options, and if necessary, the need for more tests. Vaccines  Your health care provider may recommend certain vaccines, such as:  Influenza vaccine. This is recommended every year.  Tetanus, diphtheria, and acellular pertussis (Tdap, Td) vaccine. You may need a Td booster every 10 years.  Zoster vaccine. You may need this after age 68.  Pneumococcal 13-valent conjugate (PCV13) vaccine. One dose is recommended after age 3.  Pneumococcal polysaccharide (PPSV23) vaccine. One dose is recommended after age 45. Talk to your health care provider about which screenings and vaccines you need and how often you need them. This information is not intended to replace advice given to you by your health care provider. Make sure you discuss any questions you have with your health care provider. Document Released: 03/21/2015 Document Revised: 11/12/2015 Document Reviewed: 12/24/2014 Elsevier Interactive Patient Education  2017 [place] Prevention in the Home Falls can cause injuries. They can happen to people of all ages. There are many things you can do to make your home safe and to help prevent falls. What can I do on the outside of my home?  Regularly fix the edges of walkways and driveways and fix any cracks.  Remove anything that might make you trip as you walk through a door, such as a raised step or threshold.  Trim any bushes or trees on the path to your home.  Use bright outdoor lighting.  Clear any walking paths of anything that might make someone trip, such as rocks or tools.  Regularly check to see if handrails are loose  or broken. Make sure that both sides of any steps have handrails.  Any raised decks and porches should have guardrails on the edges.  Have any leaves, snow, or ice cleared regularly.  Use sand or salt on walking paths during winter.  Clean up any spills in your garage right away. This includes oil or grease spills. What can I do in the bathroom?  Use night lights.  Install grab bars by the toilet and in the tub and shower. Do not use towel bars as grab bars.  Use non-skid mats or decals in the tub or shower.  If you need to sit down in the shower, use a plastic, non-slip stool.  Keep the floor dry. Clean up any water that spills on the floor as soon as it happens.  Remove soap buildup in the tub or shower regularly.  Attach bath mats securely with double-sided non-slip rug tape.  Do not have throw rugs and other things on the floor that can make you trip. What can I do in the bedroom?  Use night lights.  Make sure that you have a light by your bed that is easy to reach.  Do not use any sheets or blankets that are too big for your bed. They should not hang down onto the floor.  Have  a firm chair that has side arms. You can use this for support while you get dressed.  Do not have throw rugs and other things on the floor that can make you trip. What can I do in the kitchen?  Clean up any spills right away.  Avoid walking on wet floors.  Keep items that you use a lot in easy-to-reach places.  If you need to reach something above you, use a strong step stool that has a grab bar.  Keep electrical cords out of the way.  Do not use floor polish or wax that makes floors slippery. If you must use wax, use non-skid floor wax.  Do not have throw rugs and other things on the floor that can make you trip. What can I do with my stairs?  Do not leave any items on the stairs.  Make sure that there are handrails on both sides of the stairs and use them. Fix handrails that are  broken or loose. Make sure that handrails are as long as the stairways.  Check any carpeting to make sure that it is firmly attached to the stairs. Fix any carpet that is loose or worn.  Avoid having throw rugs at the top or bottom of the stairs. If you do have throw rugs, attach them to the floor with carpet tape.  Make sure that you have a light switch at the top of the stairs and the bottom of the stairs. If you do not have them, ask someone to add them for you. What else can I do to help prevent falls?  Wear shoes that:  Do not have high heels.  Have rubber bottoms.  Are comfortable and fit you well.  Are closed at the toe. Do not wear sandals.  If you use a stepladder:  Make sure that it is fully opened. Do not climb a closed stepladder.  Make sure that both sides of the stepladder are locked into place.  Ask someone to hold it for you, if possible.  Clearly mark and make sure that you can see:  Any grab bars or handrails.  First and last steps.  Where the edge of each step is.  Use tools that help you move around (mobility aids) if they are needed. These include:  Canes.  Walkers.  Scooters.  Crutches.  Turn on the lights when you go into a dark area. Replace any light bulbs as soon as they burn out.  Set up your furniture so you have a clear path. Avoid moving your furniture around.  If any of your floors are uneven, fix them.  If there are any pets around you, be aware of where they are.  Review your medicines with your doctor. Some medicines can make you feel dizzy. This can increase your chance of falling. Ask your doctor what other things that you can do to help prevent falls. This information is not intended to replace advice given to you by your health care provider. Make sure you discuss any questions you have with your health care provider. Document Released: 12/19/2008 Document Revised: 07/31/2015 Document Reviewed: 03/29/2014 Elsevier  Interactive Patient Education  2017 [employer].

## 2019-01-18 NOTE — Progress Notes (Signed)
I have reviewed the documentation from the recent AWV done by Courtney Slade; I agree with the documentation and will follow up on any recommendations or abnormal findings as suggested. Abhinav Mayorquin, MD [facility]    

## 2019-01-31 ENCOUNTER — Other Ambulatory Visit: Payer: Self-pay

## 2019-01-31 ENCOUNTER — Telehealth (INDEPENDENT_AMBULATORY_CARE_PROVIDER_SITE_OTHER): Payer: Self-pay | Admitting: Family Medicine

## 2019-01-31 DIAGNOSIS — E1169 Type 2 diabetes mellitus with other specified complication: Secondary | ICD-10-CM

## 2019-01-31 DIAGNOSIS — E785 Hyperlipidemia, unspecified: Secondary | ICD-10-CM

## 2019-01-31 MED ORDER — ROSUVASTATIN CALCIUM 5 MG PO TABS: 5.0000 mg | ORAL_TABLET | Freq: Every day | ORAL | 0 refills | Status: DC

## 2019-01-31 NOTE — Patient Outreach (Signed)
[facility]) Care Management  01/31/2019  Audreyana Quale 08/07/1945 [id number]  Medication Adherence call to [name] [place] Identifiers Verify spoke with patient she is past due on Rosuvastatin 5 mg, patient explain she takes 1 tablet daily and has medication for 8 more days,patient has an appointment on Dec.11th and will ask doctor if she needs to be on this medication.patient ask to call her new doctor to see if they want to fill this prescription before her appointment,left a message at doctors office,[name] is showing past due under [place].   [facility] [place] Management Direct Dial [phone]  Fax [phone] [email or web address]

## 2019-01-31 NOTE — Telephone Encounter (Signed)
Medication refill: rosuvastatin (CRESTOR) 5 MG tablet [id number]   Pt will run out in 8 days   [place], [place] [phone] (Phone) [phone] (Fax)   Pt is not sure if this medication needs to be decreased.

## 2019-01-31 NOTE — Telephone Encounter (Signed)
30 day supply of rosuvastatin sent in to pharmacy.  Patient has appt for TOC on 12/11

## 2019-01-31 NOTE — Telephone Encounter (Signed)
See note

## 2019-02-16 ENCOUNTER — Encounter: Payer: Self-pay | Admitting: Family Medicine

## 2019-02-16 ENCOUNTER — Ambulatory Visit (INDEPENDENT_AMBULATORY_CARE_PROVIDER_SITE_OTHER): Payer: Medicare Other | Admitting: Family Medicine

## 2019-02-16 VITALS — BP 140/80 | Ht 69.0 in | Wt 225.0 lb

## 2019-02-16 DIAGNOSIS — E1169 Type 2 diabetes mellitus with other specified complication: Secondary | ICD-10-CM

## 2019-02-16 DIAGNOSIS — E1165 Type 2 diabetes mellitus with hyperglycemia: Secondary | ICD-10-CM | POA: Diagnosis not present

## 2019-02-16 DIAGNOSIS — I10 Essential (primary) hypertension: Secondary | ICD-10-CM

## 2019-02-16 DIAGNOSIS — E1159 Type 2 diabetes mellitus with other circulatory complications: Secondary | ICD-10-CM

## 2019-02-16 DIAGNOSIS — E785 Hyperlipidemia, unspecified: Secondary | ICD-10-CM

## 2019-02-16 DIAGNOSIS — E559 Vitamin D deficiency, unspecified: Secondary | ICD-10-CM | POA: Diagnosis not present

## 2019-02-16 DIAGNOSIS — I152 Hypertension secondary to endocrine disorders: Secondary | ICD-10-CM

## 2019-02-16 MED ORDER — ROSUVASTATIN CALCIUM 5 MG PO TABS: 5.0000 mg | ORAL_TABLET | Freq: Every day | ORAL | 3 refills | Status: DC

## 2019-02-16 NOTE — Progress Notes (Signed)
Patient: Shelly Yoder MRN: [id number] DOB: 1945-06-04 PCP: Briscoe Deutscher, DO     I connected with Orest Dikes on 02/16/19 at 10:50am by a video enabled telemedicine application and verified that I am speaking with the correct person using two identifiers.  Location patient: Home Location provider:  [facility], Office Persons participating in this virtual visit: Charryse Rabanal and Dr. Rogers Blocker   I discussed the limitations of evaluation and management by telemedicine and the availability of in person appointments. The patient expressed understanding and agreed to proceed.   Interactive audio and video telecommunications were attempted between this provider and patient, however failed, due to patient having technical difficulties OR patient did not have access to video capability.  We continued and completed visit with audio only.   Subjective:  Chief Complaint  Patient presents with  . Transitions Of Care    HPI: The patient is a 73 y.o. female who presents today for transition of care from Dr. Juleen China. She has a history of diabetes, hyperlipidemia and HTN.   Hypertension: Here for follow up of hypertension.  Currently on no medication . Home readings range from [id number] diastolic. Exercise includes very little due to covid. Weight has been stable. Denies any chest pain, headaches, shortness of breath, vision changes, swelling in lower extremities.   Diabetes: Patient is here for follow up of type 2 diabetes. First diagnosed 2016, she thinks.  Currently on the following medications: none. Takes medications as prescribed. Last A1C was 7.3. Currently not exercising and is trying to follow a diabetic diet. Needs to do better. Denies any hypoglycemic events. Denies any vision changes, nausea, vomiting, abdominal pain, ulcers/paraesthesia in feet, polyuria, polydipsia or polyphagia. Denies any chest pain, shortness of breath. Never been on medication for diabetes.    Hyperlipidemia: currently on crestor 5mg . Family history of heart disease in both mother and father and MI in her mother. She has remote history of smoking about 45 years ago.   She is overdue for cscope/mmg.   Review of Systems  Constitutional: Negative for fatigue.  HENT: Negative for congestion, postnasal drip, rhinorrhea and sore throat.   Eyes: Negative for visual disturbance.  Respiratory: Negative for shortness of breath.   Cardiovascular: Negative for chest pain, palpitations and leg swelling.  Gastrointestinal: Negative for abdominal pain, constipation, diarrhea, nausea and vomiting.  Genitourinary: Positive for frequency. Negative for dysuria and urgency.  Musculoskeletal: Positive for arthralgias. Negative for back pain and neck pain.  Skin: Negative for rash.  Neurological: Negative for dizziness and headaches.  Psychiatric/Behavioral: Positive for sleep disturbance.    Allergies Patient is allergic to atorvastatin and hydrocodone-acetaminophen.  Past Medical History Patient  has a past medical history of Anemia, Anxiety, Arthritis, Bilateral hearing loss (03/29/2018), Cataracts, bilateral, Colon polyps, Diabetes mellitus without complication ([place]) (123456), Heart murmur, Hyperlipidemia, Hyperlipidemia associated with type 2 diabetes mellitus ([place]) (03/29/2018), Hypertension, Hypertension associated with diabetes ([place]) (03/29/2018), Lipoma, Lipoma of torso (03/29/2018), Refusal of blood transfusions as patient is Jehovah's Witness (12/04/2014), Type 2 diabetes mellitus with hyperglycemia, without long-term current use of insulin ([place]) (03/29/2018), and Vitamin D deficiency (03/29/2018).  Surgical History Patient  has a past surgical history that includes Colonoscopy; Lipoma excision; Breast biopsy (Right); Abdominal hysterectomy (1996); and Polypectomy.  Family History Pateint's family history includes Alcohol abuse in her father; Breast cancer in her sister; Diabetes in her  brother, father, and mother; Heart attack in her mother; Heart block in her mother; Heart disease in her father; Heart disease (age  of onset: 81) in her mother; Hyperlipidemia in her mother; Hypertension in her brother; Kidney disease in her brother, father, and mother.  Social History Patient  reports that she quit smoking about 43 years ago. She has never used smokeless tobacco. She reports current alcohol use. She reports that she does not use drugs.    Objective: Vitals:   02/16/19 1024  BP: 140/80  Weight: 225 lb (102.1 kg)  Height: 5\' 9"  (1.753 m)    Body mass index is 33.23 kg/m.  Physical Exam     Assessment/plan: 1. Hypertension associated with diabetes ([place]) Will need to come in for appointment so I can check and examine her. Overdue for labs. Home readings to goal. Will future order labs and have her come in for appointment in next few months.  - CBC with Differential/Platelet; Future - Comprehensive metabolic panel; Future  2. Hyperlipidemia associated with type 2 diabetes mellitus ([place])  - Lipid panel; Future - rosuvastatin (CRESTOR) 5 MG tablet; Take 1 tablet (5 mg total) by mouth daily.  Dispense: 90 tablet; Refill: 3  3. Type 2 diabetes mellitus with hyperglycemia, without long-term current use of insulin ([place]) Over due for labs. a1c decent for her age. Continue diet/exercise and lifestyle changes and we will see where she is at after I get her labs back.  - Microalbumin / creatinine urine ratio; Future - TSH; Future - Hemoglobin A1c; Future  4. Vitamin D deficiency  - VITAMIN D 25 Hydroxy (Vit-D Deficiency, Fractures); Future   Return in about 3 months (around 05/17/2019) for appointment and then come in now for labs. .  Records requested if needed. Time spent with patient: 20 minutes, of which >50% was spent in obtaining information about her symptoms, reviweing her previous labs, evaluations, and treatments, counseling her about her conditions (please see  discussed topics above), and developing a plan to further investigate it; she had a number of questions which I addressed.    Orma Flaming, MD [place]  02/16/2019

## 2019-03-09 HISTORY — PX: COLONOSCOPY: SHX174

## 2019-03-09 HISTORY — PX: POLYPECTOMY: SHX149

## 2019-03-22 ENCOUNTER — Other Ambulatory Visit: Payer: Self-pay

## 2019-03-22 ENCOUNTER — Ambulatory Visit
Admission: RE | Admit: 2019-03-22 | Discharge: 2019-03-22 | Disposition: A | Discharge status: Home / Self Care | Payer: Medicare Other | Source: Ambulatory Visit | Attending: Family Medicine | Admitting: Family Medicine

## 2019-03-22 DIAGNOSIS — Z1231 Encounter for screening mammogram for malignant neoplasm of breast: Secondary | ICD-10-CM

## 2019-03-28 ENCOUNTER — Ambulatory Visit: Payer: Medicare Other

## 2019-04-03 ENCOUNTER — Ambulatory Visit: Payer: Medicare Other

## 2019-04-12 ENCOUNTER — Ambulatory Visit: Payer: Medicare Other | Attending: Internal Medicine

## 2019-04-12 DIAGNOSIS — Z23 Encounter for immunization: Secondary | ICD-10-CM | POA: Insufficient documentation

## 2019-04-12 NOTE — Progress Notes (Signed)
   Covid-19 Vaccination Clinic  Name:  Shelly Yoder    MRN: [id number] DOB: [birth year]  04/12/2019  Shelly Yoder was observed post Covid-19 immunization for 15 minutes without incidence. She was provided with Vaccine Information Sheet and instruction to access the V-Safe system.   Shelly Yoder was instructed to call 911 with any severe reactions post vaccine: Marland Kitchen Difficulty breathing  . Swelling of your face and throat  . A fast heartbeat  . A bad rash all over your body  . Dizziness and weakness    Immunizations Administered    Name Date Dose VIS Date Route   Pfizer COVID-19 Vaccine 04/12/2019  8:16 AM 0.3 mL 02/16/2019 Intramuscular   Manufacturer: [place]   Lot: [id number]   [place]: [id number]

## 2019-05-07 ENCOUNTER — Ambulatory Visit: Payer: Medicare Other | Attending: Internal Medicine

## 2019-05-07 DIAGNOSIS — Z23 Encounter for immunization: Secondary | ICD-10-CM | POA: Insufficient documentation

## 2019-06-11 ENCOUNTER — Encounter: Payer: Self-pay | Admitting: Gastroenterology

## 2019-06-21 ENCOUNTER — Encounter: Payer: Self-pay | Admitting: Family Medicine

## 2019-06-21 ENCOUNTER — Ambulatory Visit (INDEPENDENT_AMBULATORY_CARE_PROVIDER_SITE_OTHER): Payer: Medicare Other | Admitting: Family Medicine

## 2019-06-21 ENCOUNTER — Other Ambulatory Visit: Payer: Self-pay

## 2019-06-21 VITALS — BP 180/100 | Temp 97.6°F | Ht 69.0 in | Wt 236.6 lb

## 2019-06-21 DIAGNOSIS — E1169 Type 2 diabetes mellitus with other specified complication: Secondary | ICD-10-CM | POA: Diagnosis not present

## 2019-06-21 DIAGNOSIS — I10 Essential (primary) hypertension: Secondary | ICD-10-CM

## 2019-06-21 DIAGNOSIS — E785 Hyperlipidemia, unspecified: Secondary | ICD-10-CM | POA: Diagnosis not present

## 2019-06-21 DIAGNOSIS — E1159 Type 2 diabetes mellitus with other circulatory complications: Secondary | ICD-10-CM

## 2019-06-21 DIAGNOSIS — E1165 Type 2 diabetes mellitus with hyperglycemia: Secondary | ICD-10-CM | POA: Diagnosis not present

## 2019-06-21 DIAGNOSIS — I152 Hypertension secondary to endocrine disorders: Secondary | ICD-10-CM

## 2019-06-21 DIAGNOSIS — E559 Vitamin D deficiency, unspecified: Secondary | ICD-10-CM | POA: Diagnosis not present

## 2019-06-21 LAB — LIPID PANEL
Cholesterol: 311 mg/dL — ABNORMAL HIGH (ref 0–200)
HDL: 60.6 mg/dL (ref >39.00)
LDL Cholesterol: 220 mg/dL — ABNORMAL HIGH (ref 0–99)
NonHDL: 250.75
Total CHOL/HDL Ratio: 5
Triglycerides: 153 mg/dL — ABNORMAL HIGH (ref 0.0–149.0)
VLDL: 30.6 mg/dL (ref 0.0–40.0)

## 2019-06-21 LAB — COMPREHENSIVE METABOLIC PANEL
ALT: 14 U/L (ref 0–35)
AST: 20 U/L (ref 0–37)
Albumin: 4.3 g/dL (ref 3.5–5.2)
Alkaline Phosphatase: 80 U/L (ref 39–117)
BUN: 12 mg/dL (ref 6–23)
CO2: 29 mEq/L (ref 19–32)
Calcium: 9.9 mg/dL (ref 8.4–10.5)
Chloride: 103 mEq/L (ref 96–112)
Creatinine, Ser: 0.71 mg/dL (ref 0.40–1.20)
GFR: 97.6 mL/min (ref >60.00)
Glucose, Bld: 183 mg/dL — ABNORMAL HIGH (ref 70–99)
Potassium: 4.2 mEq/L (ref 3.5–5.1)
Sodium: 138 mEq/L (ref 135–145)
Total Bilirubin: 0.4 mg/dL (ref 0.2–1.2)
Total Protein: 7.6 g/dL (ref 6.0–8.3)

## 2019-06-21 LAB — CBC WITH DIFFERENTIAL/PLATELET
Basophils Absolute: 0.1 10*3/uL (ref 0.0–0.1)
Basophils Relative: 0.8 % (ref 0.0–3.0)
Eosinophils Absolute: 0.1 10*3/uL (ref 0.0–0.7)
Eosinophils Relative: 1.6 % (ref 0.0–5.0)
HCT: 40 % (ref 36.0–46.0)
Hemoglobin: 13.2 g/dL (ref 12.0–15.0)
Lymphocytes Relative: 29.5 % (ref 12.0–46.0)
Lymphs Abs: 2.1 10*3/uL (ref 0.7–4.0)
MCHC: 33.1 g/dL (ref 30.0–36.0)
MCV: 87.3 fl (ref 78.0–100.0)
Monocytes Absolute: 0.7 10*3/uL (ref 0.1–1.0)
Monocytes Relative: 9.7 % (ref 3.0–12.0)
Neutro Abs: 4.2 10*3/uL (ref 1.4–7.7)
Neutrophils Relative %: 58.4 % (ref 43.0–77.0)
Platelets: 208 10*3/uL (ref 150.0–400.0)
RBC: 4.58 Mil/uL (ref 3.87–5.11)
RDW: 13.9 % (ref 11.5–15.5)
WBC: 7.1 10*3/uL (ref 4.0–10.5)

## 2019-06-21 LAB — TSH: TSH: 0.99 u[IU]/mL (ref 0.35–4.50)

## 2019-06-21 LAB — HEMOGLOBIN A1C: Hgb A1c MFr Bld: 7.9 % — ABNORMAL HIGH (ref 4.6–6.5)

## 2019-06-21 LAB — MICROALBUMIN / CREATININE URINE RATIO
Creatinine,U: 148.3 mg/dL
Microalb Creat Ratio: 1.8 mg/g (ref 0.0–30.0)
Microalb, Ur: 2.7 mg/dL — ABNORMAL HIGH (ref 0.0–1.9)

## 2019-06-21 LAB — VITAMIN D 25 HYDROXY (VIT D DEFICIENCY, FRACTURES): VITD: 23.11 ng/mL — ABNORMAL LOW (ref 30.00–100.00)

## 2019-06-21 NOTE — Patient Instructions (Signed)
1) blood pressure is really high. I want you to keep a log for me and will come back in one month WITH your cuff. If still elevated we will need to start mediation. I know you are stressed and have some white coat hypertension, but I also want to make sure you aren't running this high and putting you at higher risk of heart attack or stroke.   2) try to do your cholesterol medication every other night or even 1/2 tab every other night.   3) keep working on diet/exericse and weight loss.   See you back in one month!   Labs today!  Dr. Rogers Blocker

## 2019-06-21 NOTE — Progress Notes (Signed)
Patient: Shelly Yoder MRN: [id number] DOB: July 02, 1945 PCP: Orma Flaming, MD     Subjective:  Chief Complaint  Patient presents with  . Hypertension  . Diabetes  . Hyperlipidemia  . Leg Pain    Starting 9 months ago on and off.    HPI: The patient is a 74 y.o. female who presents today for follow up of chronic conditions and chronic right calf pain.    Hypertension: Here for follow up of hypertension.  Currently on no medication for her blood pressure.  Exercise includes stretching from program on TV and walking at park three days/week. Weight has been stable. Denies any chest pain, headaches, shortness of breath, vision changes, swelling in lower extremities. At her medicare in home visit she states her blood pressure was 140/80 and runs around this at home.   Diabetes: Patient is here for follow up of type 2 diabetes. First diagnosed 2016.  Currently on the following medications none. Takes medications as prescribed. Last A1C was 7.3. Currently exercising and following diabetic diet. Way over due for lab work and never came in for labs after our December virtual visit. Denies any hypoglycemic events. Denies any vision changes, nausea, vomiting, abdominal pain, ulcers/paraesthesia in feet, polyuria, polydipsia or polyphagia. Denies any chest pain, shortness of breath. She had a house call about 2 weeks ago and said her a1c was 6.8.   Hyperlipidemia: she was on crestor 5mg . She stopped this on her own accord due to muscle cramping about 4-5 months ago. She does have a family history of heart disease in both mother and father and MI in your mother. Remote hx of smoking 45 years ago. She takes a baby asa a day.   Leg pain: her right calf started hurting her about 9 months ago. It is on the outside of her right calf and it goes down the side of her leg. Mainly at night, but can happen during day as well. Pain rated as a 1/10. Described as cramping. It starts and stops. Does not keep her  up at night. It just goes away. Lasts for about 5 seconds. Did not get better with stopping crestor. She drinks a fair amount of water, but could do better.   Review of Systems  Constitutional: Negative for chills, fatigue and fever.  HENT: Negative for dental problem, ear pain, hearing loss and trouble swallowing.   Eyes: Negative for visual disturbance.  Respiratory: Negative for cough, chest tightness, shortness of breath and wheezing.   Cardiovascular: Negative for chest pain, palpitations and leg swelling.  Gastrointestinal: Negative for abdominal pain, blood in stool, diarrhea and nausea.  Endocrine: Negative for cold intolerance, polydipsia, polyphagia and polyuria.  Genitourinary: Negative for dysuria and hematuria.  Musculoskeletal: Positive for myalgias (right calf intermittent). Negative for arthralgias.  Skin: Negative for rash.  Neurological: Negative for dizziness and headaches.  Psychiatric/Behavioral: Negative for dysphoric mood and sleep disturbance. The patient is not nervous/anxious.     Allergies Patient is allergic to atorvastatin and hydrocodone-acetaminophen.  Past Medical History Patient  has a past medical history of Anemia, Anxiety, Arthritis, Bilateral hearing loss (03/29/2018), Cataracts, bilateral, Colon polyps, Diabetes mellitus without complication ([place]) (123456), Heart murmur, Hyperlipidemia, Hyperlipidemia associated with type 2 diabetes mellitus ([place]) (03/29/2018), Hypertension, Hypertension associated with diabetes ([place]) (03/29/2018), Lipoma, Lipoma of torso (03/29/2018), Refusal of blood transfusions as patient is Jehovah's Witness (12/04/2014), Type 2 diabetes mellitus with hyperglycemia, without long-term current use of insulin ([place]) (03/29/2018), and Vitamin D deficiency (03/29/2018).  Surgical  History Patient  has a past surgical history that includes Colonoscopy; Lipoma excision; Abdominal hysterectomy (1996); Polypectomy; and Breast biopsy (Right).  Family  History Pateint's family history includes Alcohol abuse in her father; Breast cancer in her sister; Diabetes in her brother, father, and mother; Heart attack in her mother; Heart block in her mother; Heart disease in her father; Heart disease (age of onset: 7) in her mother; Hyperlipidemia in her mother; Hypertension in her brother; Kidney disease in her brother, father, and mother.  Social History Patient  reports that she quit smoking about 44 years ago. She has never used smokeless tobacco. She reports current alcohol use. She reports that she does not use drugs.    Objective: Vitals:   06/21/19 0931 06/21/19 1005  BP: (!) 162/82 (!) 180/100  Temp: 97.6 F (36.4 C)   TempSrc: Temporal   Weight: 236 lb 9.6 oz (107.3 kg)   Height: 5\' 9"  (1.753 m)     Body mass index is 34.94 kg/m.  Physical Exam Vitals reviewed.  Constitutional:      Appearance: Normal appearance. She is obese.  HENT:     Head: Normocephalic and atraumatic.     Right Ear: Tympanic membrane, ear canal and external ear normal.     Left Ear: Tympanic membrane, ear canal and external ear normal.     Nose: Nose normal.     Mouth/Throat:     Mouth: Mucous membranes are moist.  Eyes:     Extraocular Movements: Extraocular movements intact.     Pupils: Pupils are equal, round, and reactive to light.  Neck:     Vascular: No carotid bruit.  Cardiovascular:     Rate and Rhythm: Normal rate and regular rhythm.     Pulses: Normal pulses.     Heart sounds: Normal heart sounds.  Pulmonary:     Effort: Pulmonary effort is normal.     Breath sounds: Normal breath sounds.  Abdominal:     General: Bowel sounds are normal.     Palpations: Abdomen is soft.  Musculoskeletal:     Cervical back: Normal range of motion.  Skin:    General: Skin is warm.     Capillary Refill: Capillary refill takes less than 2 seconds.     Findings: No rash.  Neurological:     General: No focal deficit present.     Mental Status: She is  alert and oriented to person, place, and time.  Psychiatric:        Mood and Affect: Mood normal.        Behavior: Behavior normal.      Office Visit from 02/16/2019 in [facility]  PHQ-2 Total Score  0     Diabetic Foot Exam - Simple   Simple Foot Form Diabetic Foot exam was performed with the following findings: Yes 06/21/2019  9:59 AM  Visual Inspection Sensation Testing Pulse Check Comments Normal exam          Assessment/plan: 1. Hypertension associated with diabetes ([place]) Extremely uncontrolled x 2 readings. This is my first time seeing patient in office. Home readings have been controlled, but she does not have cuff with her today. She states she thinks this is due to loss of a friend, white coat hypertension and being stressed. I discussed that these are too high for my comfort level of white coat hypertension. She is going to come back and bring her cuff so we can make sure calibrated correctly. I also want  her to bring her home log. She is in agreement. Precautions given. Routine lab work today.  F/u in one month.  - CBC with Differential/Platelet - Comprehensive metabolic panel  2. Hyperlipidemia associated with type 2 diabetes mellitus ([place]) Advised her to try every other night with the crestor. With her history, elevated ASCVD risk I really want her on a statin. She is open to trying every other night.  - Lipid panel  3. Type 2 diabetes mellitus with hyperglycemia, without long-term current use of insulin ([facility]) Foot exam done today and normal. She is UTD on her eye exam and will ask that dr. Schuyler Amor send this to me. Routine labs today. I really want her on statin and asked that she try to do the crestor every other day for me. Declines pneumonia shot. If we start blood pressure medication will do ACEI. Get a1c today and see if treatment needed. Encouraged exercise, diabetic diet and weight loss. F/u in 3-6 months.  - TSH - Hemoglobin A1c -  Microalbumin / creatinine urine ratio  4. Vitamin D deficiency   - VITAMIN D 25 Hydroxy (Vit-D Deficiency, Fractures)  -colonoscopy scheduled for may.  -has had her covid vaccines.   This visit occurred during the SARS-CoV-2 public health emergency.  Safety protocols were in place, including screening questions prior to the visit, additional usage of staff PPE, and extensive cleaning of exam room while observing appropriate contact time as indicated for disinfecting solutions.     Return in about 1 month (around 07/21/2019) for blood pressure .    Orma Flaming, MD [place]   06/21/2019

## 2019-06-22 ENCOUNTER — Other Ambulatory Visit: Payer: Self-pay | Admitting: Family Medicine

## 2019-06-22 MED ORDER — VITAMIN D (ERGOCALCIFEROL) 1.25 MG (50000 UNIT) PO CAPS: ORAL_CAPSULE | ORAL | 0 refills | Status: DC

## 2019-06-25 ENCOUNTER — Telehealth: Payer: Self-pay

## 2019-06-25 NOTE — Telephone Encounter (Signed)
Patient returning call regarding labs results. Patient would like a refill for blood pressure. Pt is unsure of the name of medication but she is using  [facility], [address] AT [address] Phone:  [phone]  Fax:  [phone]

## 2019-06-25 NOTE — Telephone Encounter (Signed)
She is not on blood pressure medication.... we were going to have her keep a log and return because she was really upset the day she came in.  Orma Flaming, MD [place]

## 2019-06-26 MED ORDER — ONETOUCH DELICA LANCETS 33G MISC: 4 refills | Status: DC

## 2019-06-26 MED ORDER — ONETOUCH VERIO VI STRP: ORAL_STRIP | 4 refills | Status: DC

## 2019-06-26 NOTE — Telephone Encounter (Signed)
Pt called back, told her per Dr. Rogers Blocker she is not on blood pressure medication. She wants you to keep a log of your blood pressure everyday and bring to your appt on May 13th. Pt verbalized understanding and said she is keeping a log. Told her okay is there anything else you need? Pt said needs refills on test strips and lancets. Told pt will send to pharmacy for her. Pt verbalized understanding. Rx's sent to pharmacy.

## 2019-06-26 NOTE — Telephone Encounter (Signed)
Left message on voicemail to call office.  

## 2019-06-26 NOTE — Addendum Note (Signed)
Addended by: Marian Sorrow on: 06/26/2019 02:54 PM   Modules accepted: Orders

## 2019-07-02 ENCOUNTER — Other Ambulatory Visit: Payer: Self-pay

## 2019-07-02 ENCOUNTER — Ambulatory Visit (AMBULATORY_SURGERY_CENTER): Payer: Self-pay | Admitting: *Deleted

## 2019-07-02 VITALS — Temp 97.6°F | Ht 69.0 in | Wt 237.6 lb

## 2019-07-02 DIAGNOSIS — Z86010 Personal history of colonic polyps: Secondary | ICD-10-CM

## 2019-07-02 MED ORDER — SUTAB 1479-225-188 MG PO TABS: 1.0000 | ORAL_TABLET | ORAL | 0 refills | Status: DC

## 2019-07-02 NOTE — Progress Notes (Signed)
Patient denies any allergies to egg or soy products. Patient denies complications with anesthesia/sedation.  Patient denies oxygen use at home and denies diet medications. Emmi instructions for colonoscopy explained and given to patient.  Sutab coupon given at [facility] appt.  Patient had both covid vaccinations, last one on 05/07/19.

## 2019-07-11 ENCOUNTER — Encounter: Payer: Self-pay | Admitting: Gastroenterology

## 2019-07-11 ENCOUNTER — Telehealth: Payer: Self-pay | Admitting: Gastroenterology

## 2019-07-11 NOTE — Telephone Encounter (Signed)
Pt stated that Nila Nephew is not covered by insurance.

## 2019-07-11 NOTE — Telephone Encounter (Signed)
Spoke with pt- she states she was told that pharmacy requires a prior- authorization and that they may not get the medication in time for procedure.  I explained we do not do prior authorizations for one time medications like this. Sample of Sutab left on the third floor for her to pick up today.

## 2019-07-16 ENCOUNTER — Ambulatory Visit (AMBULATORY_SURGERY_CENTER): Payer: Medicare Other | Admitting: Gastroenterology

## 2019-07-16 ENCOUNTER — Other Ambulatory Visit: Payer: Self-pay

## 2019-07-16 ENCOUNTER — Encounter: Payer: Self-pay | Admitting: Gastroenterology

## 2019-07-16 VITALS — BP 183/99 | HR 85 | Temp 97.2°F | Resp 22 | Ht 69.0 in | Wt 237.0 lb

## 2019-07-16 DIAGNOSIS — D122 Benign neoplasm of ascending colon: Secondary | ICD-10-CM | POA: Diagnosis not present

## 2019-07-16 DIAGNOSIS — D124 Benign neoplasm of descending colon: Secondary | ICD-10-CM

## 2019-07-16 DIAGNOSIS — D128 Benign neoplasm of rectum: Secondary | ICD-10-CM

## 2019-07-16 DIAGNOSIS — Z86010 Personal history of colonic polyps: Secondary | ICD-10-CM

## 2019-07-16 DIAGNOSIS — D123 Benign neoplasm of transverse colon: Secondary | ICD-10-CM | POA: Diagnosis not present

## 2019-07-16 DIAGNOSIS — Z1211 Encounter for screening for malignant neoplasm of colon: Secondary | ICD-10-CM | POA: Diagnosis not present

## 2019-07-16 MED ORDER — SODIUM CHLORIDE 0.9 % IV SOLN: 500.0000 mL | INTRAVENOUS | Status: DC

## 2019-07-16 NOTE — Op Note (Signed)
[facility] Patient Name: Shelly Yoder Procedure Date: 07/16/2019 11:25 AM MRN: [id number] Endoscopist: Ladene Artist , MD Age: 74 Referring MD:  Date of Birth: February 09, 1946 Gender: Female Account #: [id number] Procedure:                Colonoscopy Indications:              Surveillance: Personal history of adenomatous                            polyps on last colonoscopy > 3 years ago Medicines:                Monitored Anesthesia Care Procedure:                Pre-Anesthesia Assessment:                           - Prior to the procedure, a History and Physical                            was performed, and patient medications and                            allergies were reviewed. The patient's tolerance of                            previous anesthesia was also reviewed. The risks                            and benefits of the procedure and the sedation                            options and risks were discussed with the patient.                            All questions were answered, and informed consent                            was obtained. Prior Anticoagulants: The patient has                            taken no previous anticoagulant or antiplatelet                            agents. ASA Grade Assessment: II - A patient with                            mild systemic disease. After reviewing the risks                            and benefits, the patient was deemed in                            satisfactory condition to undergo the procedure.  After obtaining informed consent, the colonoscope                            was passed under direct vision. Throughout the                            procedure, the patient's blood pressure, pulse, and                            oxygen saturations were monitored continuously. The                            Colonoscope was introduced through the anus and                            advanced to the the cecum,  identified by                            appendiceal orifice and ileocecal valve. The                            ileocecal valve, appendiceal orifice, and rectum                            were photographed. The quality of the bowel                            preparation was good. The colonoscopy was performed                            without difficulty. The patient tolerated the                            procedure well. Scope In: 11:30:00 AM Scope Out: 11:50:31 AM Scope Withdrawal Time: 0 hours 17 minutes 17 seconds  Total Procedure Duration: 0 hours 20 minutes 31 seconds  Findings:                 The perianal and digital rectal examinations were                            normal.                           Ten sessile polyps were found in the rectum (1)                            descending colon (4), transverse colon (4) and                            ascending colon (1). The polyps were 6 to 9 mm in                            size. These polyps were removed with a cold snare.  Resection and retrieval were complete.                           Multiple small-mouthed diverticula were found in                            the left colon. There was narrowing of the colon in                            association with the diverticular opening. There                            was evidence of diverticular spasm. There was no                            evidence of diverticular bleeding.                           The exam was otherwise without abnormality on                            direct and retroflexion views. Complications:            No immediate complications. Estimated blood loss:                            None. Estimated Blood Loss:     Estimated blood loss: none. Impression:               - Ten 6 to 9 mm polyps in the rectum, in the                            descending colon, in the transverse colon and in                            the ascending colon,  removed with a cold snare.                            Resected and retrieved.                           - Mild diverticulosis in the left colon.                           - The examination was otherwise normal on direct                            and retroflexion views. Recommendation:           - Repeat colonoscopy after studies are complete for                            surveillance based on pathology results.                           - Patient has a  contact number available for                            emergencies. The signs and symptoms of potential                            delayed complications were discussed with the                            patient. Return to normal activities tomorrow.                            Written discharge instructions were provided to the                            patient.                           - High fiber diet.                           - Continue present medications.                           - Await pathology results. Ladene Artist, MD 07/16/2019 11:55:43 AM This report has been signed electronically.

## 2019-07-16 NOTE — Patient Instructions (Signed)
Please read handouts provided. Continue present medications. Await pathology results. High Fiber diet.     YOU HAD AN ENDOSCOPIC PROCEDURE TODAY AT THE [facility]:   Refer to the procedure report that was given to you for any specific questions about what was found during the examination.  If the procedure report does not answer your questions, please call your gastroenterologist to clarify.  If you requested that your care partner not be given the details of your procedure findings, then the procedure report has been included in a sealed envelope for you to review at your convenience later.  YOU SHOULD EXPECT: Some feelings of bloating in the abdomen. Passage of more gas than usual.  Walking can help get rid of the air that was put into your GI tract during the procedure and reduce the bloating. If you had a lower endoscopy (such as a colonoscopy or flexible sigmoidoscopy) you may notice spotting of blood in your stool or on the toilet paper. If you underwent a bowel prep for your procedure, you may not have a normal bowel movement for a few days.  Please Note:  You might notice some irritation and congestion in your nose or some drainage.  This is from the oxygen used during your procedure.  There is no need for concern and it should clear up in a day or so.  SYMPTOMS TO REPORT IMMEDIATELY:   Following lower endoscopy (colonoscopy or flexible sigmoidoscopy):  Excessive amounts of blood in the stool  Significant tenderness or worsening of abdominal pains  Swelling of the abdomen that is new, acute  Fever of 100F or higher   For urgent or emergent issues, a gastroenterologist can be reached at any hour by calling [phone]. Do not use MyChart messaging for urgent concerns.    DIET:  We do recommend a small meal at first, but then you may proceed to your regular diet.  Drink plenty of fluids but you should avoid alcoholic beverages for 24 hours.  ACTIVITY:  You should  plan to take it easy for the rest of today and you should NOT DRIVE or use heavy machinery until tomorrow (because of the sedation medicines used during the test).    FOLLOW UP: Our staff will call the number listed on your records 48-72 hours following your procedure to check on you and address any questions or concerns that you may have regarding the information given to you following your procedure. If we do not reach you, we will leave a message.  We will attempt to reach you two times.  During this call, we will ask if you have developed any symptoms of COVID 19. If you develop any symptoms (ie: fever, flu-like symptoms, shortness of breath, cough etc.) before then, please call [phone].  If you test positive for Covid 19 in the 2 weeks post procedure, please call and report this information to us.    If any biopsies were taken you will be contacted by phone or by letter within the next 1-3 weeks.  Please call us at [phone] if you have not heard about the biopsies in 3 weeks.    SIGNATURES/CONFIDENTIALITY: You and/or your care partner have signed paperwork which will be entered into your electronic medical record.  These signatures attest to the fact that that the information above on your After Visit Summary has been reviewed and is understood.  Full responsibility of the confidentiality of this discharge information lies with you and/or your care-partner.   care-partner.

## 2019-07-16 NOTE — Progress Notes (Signed)
A and O x3. Report to RN. Tolerated MAC anesthesia well.

## 2019-07-18 ENCOUNTER — Telehealth: Payer: Self-pay | Admitting: *Deleted

## 2019-07-18 NOTE — Telephone Encounter (Signed)
First attempt, left VM.  

## 2019-07-18 NOTE — Telephone Encounter (Signed)
Second attempt, left VM.  

## 2019-07-19 ENCOUNTER — Encounter: Payer: Self-pay | Admitting: Family Medicine

## 2019-07-19 ENCOUNTER — Other Ambulatory Visit: Payer: Self-pay

## 2019-07-19 ENCOUNTER — Ambulatory Visit (INDEPENDENT_AMBULATORY_CARE_PROVIDER_SITE_OTHER): Payer: Medicare Other | Admitting: Family Medicine

## 2019-07-19 VITALS — BP 180/110 | HR 89 | Temp 97.5°F | Ht 69.0 in | Wt 235.0 lb

## 2019-07-19 DIAGNOSIS — E785 Hyperlipidemia, unspecified: Secondary | ICD-10-CM

## 2019-07-19 DIAGNOSIS — I10 Essential (primary) hypertension: Secondary | ICD-10-CM | POA: Diagnosis not present

## 2019-07-19 DIAGNOSIS — E1169 Type 2 diabetes mellitus with other specified complication: Secondary | ICD-10-CM

## 2019-07-19 DIAGNOSIS — E1165 Type 2 diabetes mellitus with hyperglycemia: Secondary | ICD-10-CM | POA: Diagnosis not present

## 2019-07-19 DIAGNOSIS — E1159 Type 2 diabetes mellitus with other circulatory complications: Secondary | ICD-10-CM

## 2019-07-19 MED ORDER — LISINOPRIL 20 MG PO TABS: 20.0000 mg | ORAL_TABLET | Freq: Every day | ORAL | 3 refills | Status: DC

## 2019-07-19 NOTE — Patient Instructions (Signed)
Starting you on lisinopril for your blood pressure and protection of kidneys for diabetes. We will start you on 20mg /day. I will see you back in 3 months time for blood pressure check and diabetes check.    As far as your diabetes go, I will give you 3 months to work on diet and exercise and we will recheck you in 3 months. If a1c is still quite high we can discuss medication together.   Glad you are tolerating the cholesterol medication well!    See you in 3 months. DONT forget your blood pressure cuff!   Enjoy your summer!  Dr. Rogers Blocker

## 2019-07-19 NOTE — Progress Notes (Signed)
Patient: Shelly Yoder MRN: [id number] DOB: January 11, 1946 PCP: Shelly Flaming, MD     Subjective:  Chief Complaint  Patient presents with  . Hypertension  . Hyperlipidemia  . Diabetes    HPI: The patient is a 74 y.o. female who presents today for Hypertension, diabetes and hyperlipidemia follow up.  Hypertension: Here for follow up of hypertension.  Currently on no medication . Home readings range from [id number] diastolic. I saw her a month ago and reading was 180/100. She was not keen to start medication and so we have her back today for recheck. Exercise includes none. Weight has been stable. Denies any chest pain, headaches, shortness of breath, vision changes, swelling in lower extremities.  She was supposed to bring her blood pressure cuff into office with her today and did not do this. She is working on her diet and not watching the news nightly so she feels less anxious.    Diabetes -follow up after lab work.  -a1c was 7.9  Hyperlipidemia -I started her on crestor 4 weeks ago. She is tolerating well and doing it every other day.   She has had her covid vaccines.   Review of Systems  Respiratory: Negative for cough, chest tightness and shortness of breath.   Cardiovascular: Negative for chest pain and palpitations.  Gastrointestinal: Negative for abdominal pain, nausea and vomiting.  Neurological: Negative for dizziness, light-headedness and headaches.    Allergies Patient is allergic to atorvastatin and hydrocodone-acetaminophen.  Past Medical History Patient  has a past medical history of Anemia, Anxiety, Arthritis, Bilateral hearing loss (03/29/2018), Cataracts, bilateral, Colon polyps, Depression, Diabetes mellitus without complication ([place]) (123456), Heart murmur, Hyperlipidemia, Hyperlipidemia associated with type 2 diabetes mellitus ([place]) (03/29/2018), Hypertension, Hypertension associated with diabetes ([place]) (03/29/2018), Lipoma, Lipoma of torso  (03/29/2018), Refusal of blood transfusions as patient is Jehovah's Witness (12/04/2014), Type 2 diabetes mellitus with hyperglycemia, without long-term current use of insulin ([place]) (03/29/2018), and Vitamin D deficiency (03/29/2018).  Surgical History Patient  has a past surgical history that includes Lipoma excision; Abdominal hysterectomy (1996); Polypectomy; Breast biopsy (Right); and Colonoscopy (06/12/2015).  Family History Pateint's family history includes Alcohol abuse in her father; Breast cancer in her sister; Diabetes in her brother, father, and mother; Heart attack in her mother; Heart block in her mother; Heart disease in her father; Heart disease (age of onset: 10) in her mother; Hyperlipidemia in her mother; Hypertension in her brother; Kidney disease in her brother, father, and mother.  Social History Patient  reports that she quit smoking about 44 years ago. Her smoking use included cigarettes. She has never used smokeless tobacco. She reports current alcohol use. She reports that she does not use drugs.    Objective: Vitals:   07/19/19 0832 07/19/19 0901  BP: (!) 152/100 (!) 180/110  Pulse: 89   Temp: (!) 97.5 F (36.4 C)   TempSrc: Temporal   SpO2: 99%   Weight: 235 lb (106.6 kg)   Height: 5\' 9"  (1.753 m)     Body mass index is 34.7 kg/m.  Physical Exam Vitals reviewed.  Constitutional:      Appearance: Normal appearance. She is well-developed. She is obese.  HENT:     Head: Normocephalic and atraumatic.     Right Ear: Tympanic membrane, ear canal and external ear normal.     Left Ear: Tympanic membrane, ear canal and external ear normal.  Eyes:     Conjunctiva/sclera: Conjunctivae normal.     Pupils: Pupils are equal, round,  and reactive to light.  Neck:     Thyroid: No thyromegaly.  Cardiovascular:     Rate and Rhythm: Normal rate and regular rhythm.     Heart sounds: Normal heart sounds. No murmur.  Pulmonary:     Effort: Pulmonary effort is normal.      Breath sounds: Normal breath sounds.  Abdominal:     General: Bowel sounds are normal. There is no distension.     Palpations: Abdomen is soft.     Tenderness: There is no abdominal tenderness.  Musculoskeletal:     Cervical back: Normal range of motion and neck supple.  Lymphadenopathy:     Cervical: No cervical adenopathy.  Skin:    General: Skin is warm and dry.     Findings: No rash.  Neurological:     General: No focal deficit present.     Mental Status: She is alert and oriented to person, place, and time.     Cranial Nerves: No cranial nerve deficit.     Coordination: Coordination normal.     Deep Tendon Reflexes: Reflexes normal.  Psychiatric:        Mood and Affect: Mood normal.        Behavior: Behavior normal.        Assessment/plan: 1. Hypertension associated with diabetes ([place]) Extremely above goal. Forgot her cuff. She is more keen to start medication today. We are going to do lisinopril 20mg . Likely will need titration up, but will slowly bring her down. Advised she continue with her home log and bring cuff to next visit. Side effects of ACE-I discussed including dry cough and angioedema. She is to call me if feels like they have a dry cough and they are to call 911 or go to ER if any signs/symptoms of angioedema.    2. Type 2 diabetes mellitus with hyperglycemia, without long-term current use of insulin (HCC) a1c of 7.9. discussed this is a bit above goal for her and would like her closer to 7.0-7.5. will give her three months to work on diet/exercise and weight loss. Recheck a1c at follow up. Starting ACE-I/on statin. Needs eye exam. Foot exam utd .  3. Hyperlipidemia associated with type 2 diabetes mellitus ([place]) Tolerating crestor every other night. Will recheck in 3 months when she returns.      This visit occurred during the SARS-CoV-2 public health emergency.  Safety protocols were in place, including screening questions prior to the visit, additional  usage of staff PPE, and extensive cleaning of exam room while observing appropriate contact time as indicated for disinfecting solutions.    Return in about 3 months (around 10/19/2019) for  htn/diabetes check .   Shelly Flaming, MD [place]   07/19/2019

## 2019-07-24 ENCOUNTER — Encounter: Payer: Self-pay | Admitting: Gastroenterology

## 2019-08-15 ENCOUNTER — Other Ambulatory Visit: Payer: Self-pay | Admitting: Family Medicine

## 2019-10-18 ENCOUNTER — Ambulatory Visit: Payer: Medicare Other | Admitting: Family Medicine

## 2019-11-08 ENCOUNTER — Other Ambulatory Visit: Payer: Self-pay

## 2019-11-08 ENCOUNTER — Encounter: Payer: Self-pay | Admitting: Family Medicine

## 2019-11-08 ENCOUNTER — Ambulatory Visit (INDEPENDENT_AMBULATORY_CARE_PROVIDER_SITE_OTHER): Payer: Medicare Other | Admitting: Family Medicine

## 2019-11-08 VITALS — BP 168/86 | HR 80 | Temp 94.3°F | Ht 69.0 in | Wt 230.0 lb

## 2019-11-08 DIAGNOSIS — E559 Vitamin D deficiency, unspecified: Secondary | ICD-10-CM

## 2019-11-08 DIAGNOSIS — I10 Essential (primary) hypertension: Secondary | ICD-10-CM

## 2019-11-08 DIAGNOSIS — E1165 Type 2 diabetes mellitus with hyperglycemia: Secondary | ICD-10-CM

## 2019-11-08 DIAGNOSIS — E1169 Type 2 diabetes mellitus with other specified complication: Secondary | ICD-10-CM

## 2019-11-08 DIAGNOSIS — E1159 Type 2 diabetes mellitus with other circulatory complications: Secondary | ICD-10-CM | POA: Diagnosis not present

## 2019-11-08 DIAGNOSIS — E785 Hyperlipidemia, unspecified: Secondary | ICD-10-CM

## 2019-11-08 LAB — POCT GLYCOSYLATED HEMOGLOBIN (HGB A1C): Hemoglobin A1C: 6.9 % — AB (ref 4.0–5.6)

## 2019-11-08 MED ORDER — LISINOPRIL 30 MG PO TABS: 30.0000 mg | ORAL_TABLET | Freq: Every day | ORAL | 1 refills | Status: DC

## 2019-11-08 NOTE — Patient Instructions (Signed)
-  diabetes [facility] better! a1c down to 6.9!!!!! Way to go!   -blood pressure improved, but want a little bit better. Increasing your lisinopril to 30mg /day. I sent in a new prescription for you so stop the 20mg  once you pick this up.   -checking vitamin D. Make sure you are taking 2000IU/day over the counter. This will be a much lower amount in your multi vitamin.   So good to see you!!!  See you in 3 months!  Dr. Rogers Blocker

## 2019-11-08 NOTE — Progress Notes (Signed)
Patient: Shelly Yoder MRN: [id number] DOB: 1945-06-29 PCP: Orma Flaming, MD     Subjective:  Chief Complaint  Patient presents with  . Hypertension  . Diabetes  . Hyperlipidemia    HPI: The patient is a 74 y.o. female who presents today for Hypertension and Diabetes as well as hyperlipidemia.   Hypertension We started lisinopril 20mg  at our last visit. She did bring her blood pressure cuff. She states her readings at home has been around 140/80 but can fluctuate. Scrolling through her cuff readings average are around 140/85 has a few outliers to 150/100. She did not take her medication this morning as she was running late. She is tolerating medication fine with no side effects. Denies any shortness of breath, chest pain, leg swelling or vision changes. Has not really been exercising due to the heat.   Diabetes A1c was 7.9 on our last visit 3 months ago. She really wanted to work on diet and exercise for 3 months before initiating medication. She has changed her diet, but has limited exercise. Does not check sugars at home.   Hyperlipidemia Started her on crestor 3 months ago due to exteremly elevated ASVD risk, diabetes and elevated cholesterol. She is fasting today.   Vitamin D deficiency Took 8 week high dose repletion. Only on MV that doesn't have enough D in it.  .   Review of Systems  Constitutional: Negative for chills, fatigue and fever.  HENT: Negative for dental problem, ear pain, hearing loss and trouble swallowing.   Eyes: Negative for visual disturbance.  Respiratory: Negative for cough, chest tightness and shortness of breath.   Cardiovascular: Negative for chest pain, palpitations and leg swelling.  Gastrointestinal: Negative for abdominal pain, blood in stool, diarrhea and nausea.  Endocrine: Negative for cold intolerance, polydipsia, polyphagia and polyuria.  Genitourinary: Negative for dysuria, frequency, hematuria, pelvic pain and urgency.   Musculoskeletal: Negative for arthralgias.  Skin: Negative for rash.  Neurological: Negative for dizziness and headaches.  Psychiatric/Behavioral: Negative for dysphoric mood and sleep disturbance. The patient is not nervous/anxious.     Allergies Patient is allergic to atorvastatin and hydrocodone-acetaminophen.  Past Medical History Patient  has a past medical history of Anemia, Anxiety, Arthritis, Bilateral hearing loss (03/29/2018), Cataracts, bilateral, Colon polyps, Depression, Diabetes mellitus without complication ([place]) (5974), Heart murmur, Hyperlipidemia, Hyperlipidemia associated with type 2 diabetes mellitus ([place]) (03/29/2018), Hypertension, Hypertension associated with diabetes ([place]) (03/29/2018), Lipoma, Lipoma of torso (03/29/2018), Refusal of blood transfusions as patient is Jehovah's Witness (12/04/2014), Type 2 diabetes mellitus with hyperglycemia, without long-term current use of insulin ([place]) (03/29/2018), and Vitamin D deficiency (03/29/2018).  Surgical History Patient  has a past surgical history that includes Lipoma excision; Abdominal hysterectomy (1996); Polypectomy; Breast biopsy (Right); and Colonoscopy (06/12/2015).  Family History Pateint's family history includes Alcohol abuse in her father; Breast cancer in her sister; Diabetes in her brother, father, and mother; Heart attack in her mother; Heart block in her mother; Heart disease in her father; Heart disease (age of onset: 60) in her mother; Hyperlipidemia in her mother; Hypertension in her brother; Kidney disease in her brother, father, and mother.  Social History Patient  reports that she quit smoking about 44 years ago. Her smoking use included cigarettes. She has never used smokeless tobacco. She reports current alcohol use. She reports that she does not use drugs.    Objective: Vitals:   11/08/19 0809  BP: (!) 168/86  Pulse: 80  Temp: (!) 94.3 F (34.6 C)  TempSrc:  Temporal  SpO2: 100%  Weight: 230 lb  (104.3 kg)  Height: 5\' 9"  (1.753 m)    Body mass index is 33.97 kg/m.  Physical Exam Vitals reviewed.  Constitutional:      Appearance: Normal appearance. She is obese.  HENT:     Head: Normocephalic and atraumatic.  Neck:     Vascular: No carotid bruit.  Cardiovascular:     Rate and Rhythm: Normal rate and regular rhythm.     Heart sounds: Normal heart sounds.  Pulmonary:     Effort: Pulmonary effort is normal.     Breath sounds: Normal breath sounds.  Abdominal:     General: Bowel sounds are normal.     Palpations: Abdomen is soft.  Musculoskeletal:     Cervical back: Normal range of motion and neck supple.  Neurological:     General: No focal deficit present.     Mental Status: She is alert and oriented to person, place, and time.  Psychiatric:        Mood and Affect: Mood normal.        Behavior: Behavior normal.       A1c: 6.9     Assessment/plan: 1. Hypertension associated with diabetes ([place]) Improved and home readings are even better. Going to increase her lisinopril to 30mg /day. continue with home log, exercise and low salt diet. See her back in 3 months time with routine labs.   2. Vitamin D deficiency Discussed needs to get D3, not enough D in her MV and would prefer over 50 with covid risk so high in community right now.  - VITAMIN D 25 Hydroxy (Vit-D Deficiency, Fractures); Future  3. Hyperlipidemia associated with type 2 diabetes mellitus ([place]) Repeat lipids after starting crestor.  - Lipid panel; Future  4. Type 2 diabetes mellitus with hyperglycemia, without long-term current use of insulin (HCC) MUCH improved. Very proud of her. Near to goal for her age. Continue with diet and try to implement exercise into her daily routine. See back in 3 months time for blood pressure check and routine labs.  - Hemoglobin A1c; Future     This visit occurred during the SARS-CoV-2 public health emergency.  Safety protocols were in place, including screening  questions prior to the visit, additional usage of staff PPE, and extensive cleaning of exam room while observing appropriate contact time as indicated for disinfecting solutions.     Return in about 3 months (around 02/07/2020) for htn/diabetes/routine labs .   Orma Flaming, MD [place]   11/08/2019

## 2019-11-09 ENCOUNTER — Other Ambulatory Visit: Payer: Self-pay | Admitting: Family Medicine

## 2019-11-09 LAB — LIPID PANEL
Cholesterol: 284 mg/dL — ABNORMAL HIGH (ref <200)
HDL: 56 mg/dL (ref >=50)
LDL Cholesterol (Calc): 199 mg/dL (calc) — ABNORMAL HIGH
Non-HDL Cholesterol (Calc): 228 mg/dL (calc) — ABNORMAL HIGH (ref <130)
Total CHOL/HDL Ratio: 5.1 (calc) — ABNORMAL HIGH (ref <5.0)
Triglycerides: 137 mg/dL (ref <150)

## 2019-11-09 LAB — HEMOGLOBIN A1C
Hgb A1c MFr Bld: 7.3 % of total Hgb — ABNORMAL HIGH (ref <5.7)
Mean Plasma Glucose: 163 (calc)
eAG (mmol/L): 9 (calc)

## 2019-11-09 LAB — VITAMIN D 25 HYDROXY (VIT D DEFICIENCY, FRACTURES): Vit D, 25-Hydroxy: 19 ng/mL — ABNORMAL LOW (ref 30–100)

## 2019-11-09 MED ORDER — VITAMIN D (ERGOCALCIFEROL) 1.25 MG (50000 UNIT) PO CAPS: ORAL_CAPSULE | ORAL | 0 refills | Status: DC

## 2019-11-13 ENCOUNTER — Ambulatory Visit: Payer: Self-pay | Attending: Internal Medicine

## 2019-11-13 DIAGNOSIS — Z23 Encounter for immunization: Secondary | ICD-10-CM

## 2019-11-13 NOTE — Progress Notes (Unsigned)
   Covid-19 Vaccination Clinic  Name:  Shelly Yoder    MRN: [id number] DOB: Apr 19, 1945  11/13/2019  Shelly Yoder was observed post Covid-19 immunization for {COVID Vaccine Observation Times:23551} without incident. She was provided with Vaccine Information Sheet and instruction to access the V-Safe system.   Shelly Yoder was instructed to call 911 with any severe reactions post vaccine: Marland Kitchen Difficulty breathing  . Swelling of face and throat  . A fast heartbeat  . A bad rash all over body  . Dizziness and weakness

## 2019-11-13 NOTE — Progress Notes (Unsigned)
   Covid-19 Vaccination Clinic  Name:  Shelly Yoder    MRN: [id number] DOB: 04-22-1945  11/13/2019  Shelly Yoder was observed post Covid-19 immunization for 15 minutes without incident. She was provided with Vaccine Information Sheet and instruction to access the V-Safe system.   Shelly Yoder was instructed to call 911 with any severe reactions post vaccine: Marland Kitchen Difficulty breathing  . Swelling of face and throat  . A fast heartbeat  . A bad rash all over body  . Dizziness and weakness

## 2020-01-17 ENCOUNTER — Other Ambulatory Visit: Payer: Self-pay

## 2020-01-17 DIAGNOSIS — E1169 Type 2 diabetes mellitus with other specified complication: Secondary | ICD-10-CM

## 2020-01-17 MED ORDER — ROSUVASTATIN CALCIUM 5 MG PO TABS: 5.0000 mg | ORAL_TABLET | ORAL | 3 refills | Status: DC

## 2020-02-07 ENCOUNTER — Ambulatory Visit (INDEPENDENT_AMBULATORY_CARE_PROVIDER_SITE_OTHER): Payer: Medicare Other | Admitting: Family Medicine

## 2020-02-07 ENCOUNTER — Other Ambulatory Visit: Payer: Self-pay

## 2020-02-07 ENCOUNTER — Encounter: Payer: Self-pay | Admitting: Family Medicine

## 2020-02-07 VITALS — BP 160/80 | HR 79 | Temp 97.2°F | Ht 69.0 in | Wt 227.0 lb

## 2020-02-07 DIAGNOSIS — I152 Hypertension secondary to endocrine disorders: Secondary | ICD-10-CM

## 2020-02-07 DIAGNOSIS — E559 Vitamin D deficiency, unspecified: Secondary | ICD-10-CM | POA: Diagnosis not present

## 2020-02-07 DIAGNOSIS — E1165 Type 2 diabetes mellitus with hyperglycemia: Secondary | ICD-10-CM | POA: Diagnosis not present

## 2020-02-07 DIAGNOSIS — E1159 Type 2 diabetes mellitus with other circulatory complications: Secondary | ICD-10-CM

## 2020-02-07 DIAGNOSIS — N959 Unspecified menopausal and perimenopausal disorder: Secondary | ICD-10-CM

## 2020-02-07 NOTE — Patient Instructions (Addendum)
-  I ordered a bone scan for you last time. Please make sure you do this! Screens for osteoporosis.   -check for your vitamin D. Need this weekly x 12 weeks and then get over the counter D3 at 2000-3000IU/day.   -keep up the good work for your diabetes.   -blood pressure still above goal. Make sure you get an arm cuff and keep a log for me. Ideally like you 140/80 or less.   -cholesterol is still really really high. If you can only tolerate crestor 5mg  every other day we could add on a drug called zetia, but this would not have the ability to get your LDL less than 100. We can talk about next visit.   So good to see you!  Merry christmas!  Dr. Rogers Blocker

## 2020-02-07 NOTE — Progress Notes (Signed)
Patient: Shelly Yoder MRN: [id number] DOB: 1945/05/04 PCP: Orma Flaming, MD     Subjective:  Chief Complaint  Patient presents with  . Hypertension  . Diabetes  . Hyperlipidemia  . vitamin d deficiency    HPI: The patient is a 74 y.o. female who presents today for HTN, Hyperlipidemia, DM. No concerns.  Hypertension: Here for follow up of hypertension.  Currently on lisinopril 30mg  . Home readings range from  354 [id number]/37 diastolic. Takes medication as prescribed and denies any side effects. Exercise includes none. Weight has been stable. Denies any chest pain, headaches, shortness of breath, vision changes, swelling in lower extremities. She did not take her medication this AM.   Diabetes A1c was 6.9 on our last visit 3 months ago. She really wanted to work on diet and exercise for 3 months before initiating medication. She has changed her diet, but has limited exercise. Does not check sugars at home. Diet is still better and she states she did well over the holidays. She has lost 10 pounds since April of this year.   Hyperlipidemia Currently on crestor 5mg  daily, but she is taking every other day. Discussed her lipid panel is still way off of goal.   Vitamin D deficiency Was very low last time we checked. She didn't pick up the vitamin D or start this.  I ordered a bone scan for her last visit and she has not gotten this done. Doesn't recall getting a call about this either.    Review of Systems  Constitutional: Negative for chills, fatigue and fever.  HENT: Negative for dental problem, ear pain, hearing loss and trouble swallowing.   Eyes: Negative for visual disturbance.  Respiratory: Negative for cough, chest tightness and shortness of breath.   Cardiovascular: Negative for chest pain, palpitations and leg swelling.  Gastrointestinal: Negative for abdominal pain, blood in stool, diarrhea and nausea.  Endocrine: Negative for cold intolerance, polydipsia, polyphagia  and polyuria.  Genitourinary: Negative for dysuria and hematuria.  Musculoskeletal: Negative for arthralgias.  Skin: Negative for rash.  Neurological: Negative for dizziness and headaches.  Psychiatric/Behavioral: Negative for dysphoric mood and sleep disturbance. The patient is not nervous/anxious.     Allergies Patient is allergic to atorvastatin and hydrocodone-acetaminophen.  Past Medical History Patient  has a past medical history of Anemia, Anxiety, Arthritis, Bilateral hearing loss (03/29/2018), Cataracts, bilateral, Colon polyps, Depression, Diabetes mellitus without complication ([place]) (3428), Heart murmur, Hyperlipidemia, Hyperlipidemia associated with type 2 diabetes mellitus ([place]) (03/29/2018), Hypertension, Hypertension associated with diabetes ([place]) (03/29/2018), Lipoma, Lipoma of torso (03/29/2018), Refusal of blood transfusions as patient is Jehovah's Witness (12/04/2014), Type 2 diabetes mellitus with hyperglycemia, without long-term current use of insulin ([place]) (03/29/2018), and Vitamin D deficiency (03/29/2018).  Surgical History Patient  has a past surgical history that includes Lipoma excision; Abdominal hysterectomy (1996); Polypectomy; Breast biopsy (Right); and Colonoscopy (06/12/2015).  Family History Pateint's family history includes Alcohol abuse in her father; Breast cancer in her sister; Diabetes in her brother, father, and mother; Heart attack in her mother; Heart block in her mother; Heart disease in her father; Heart disease (age of onset: 86) in her mother; Hyperlipidemia in her mother; Hypertension in her brother; Kidney disease in her brother, father, and mother.  Social History Patient  reports that she quit smoking about 44 years ago. Her smoking use included cigarettes. She has never used smokeless tobacco. She reports current alcohol use. She reports that she does not use drugs.    Objective: Vitals:  02/07/20 0813 02/07/20 0833  BP: (!) 156/84 (!) 160/80   Pulse: 79   Temp: (!) 97.2 F (36.2 C)   TempSrc: Temporal   SpO2: 100%   Weight: 227 lb (103 kg)   Height: 5\' 9"  (1.753 m)     Body mass index is 33.52 kg/m.  Physical Exam Vitals reviewed.  Constitutional:      Appearance: Normal appearance. She is well-developed. She is obese.  HENT:     Head: Normocephalic and atraumatic.     Right Ear: Tympanic membrane, ear canal and external ear normal.     Left Ear: Tympanic membrane, ear canal and external ear normal.  Eyes:     Conjunctiva/sclera: Conjunctivae normal.     Pupils: Pupils are equal, round, and reactive to light.  Neck:     Thyroid: No thyromegaly.     Vascular: No carotid bruit.  Cardiovascular:     Rate and Rhythm: Normal rate and regular rhythm.     Heart sounds: Normal heart sounds. No murmur heard.   Pulmonary:     Effort: Pulmonary effort is normal.     Breath sounds: Normal breath sounds.  Abdominal:     General: Bowel sounds are normal. There is no distension.     Palpations: Abdomen is soft.     Tenderness: There is no abdominal tenderness.  Musculoskeletal:     Cervical back: Normal range of motion and neck supple.  Lymphadenopathy:     Cervical: No cervical adenopathy.  Skin:    General: Skin is warm and dry.     Capillary Refill: Capillary refill takes less than 2 seconds.     Findings: No rash.  Neurological:     General: No focal deficit present.     Mental Status: She is alert and oriented to person, place, and time.     Cranial Nerves: No cranial nerve deficit.     Coordination: Coordination normal.     Deep Tendon Reflexes: Reflexes normal.  Psychiatric:        Mood and Affect: Mood normal.        Behavior: Behavior normal.        Assessment/plan: 1. Hypertension associated with diabetes ([place]) Has not taken her medication today. Blood pressure is improved from past readings. Also advised to get an arm cuff and not a wrist cuff for better accuracy. Will not adjust the 30mg  at this  point. Discussed to please take medication before appointment and bring her log with her.  Routine labs today, f/u in 3 months.  - CBC with Differential/Platelet; Future - COMPLETE METABOLIC PANEL WITH GFR; Future - COMPLETE METABOLIC PANEL WITH GFR - CBC with Differential/Platelet  2. Type 2 diabetes mellitus with hyperglycemia, without long-term current use of insulin (HCC) Check today. Continues to lose weight and manage via lifestyle and diet -on statin/acei -declines pneumovax, has had covid vaccines -needs eye exam.  -fu in 3 months.  - Hemoglobin A1c; Future - Hemoglobin A1c  3. Vitamin D deficiency Will check to see if she picked up px and start this.   4. Menopausal and postmenopausal disorder Handout given to get her bone scan done.   This visit occurred during the SARS-CoV-2 public health emergency.  Safety protocols were in place, including screening questions prior to the visit, additional usage of staff PPE, and extensive cleaning of exam room while observing appropriate contact time as indicated for disinfecting solutions.      Return in about 3 months (around 05/07/2020)  for diabetes/htn/vit. d .   Orma Flaming, MD [place]   02/07/2020

## 2020-02-08 LAB — COMPLETE METABOLIC PANEL WITH GFR
AG Ratio: 1.3 (calc) (ref 1.0–2.5)
ALT: 13 U/L (ref 6–29)
AST: 20 U/L (ref 10–35)
Albumin: 4.2 g/dL (ref 3.6–5.1)
Alkaline phosphatase (APISO): 73 U/L (ref 37–153)
BUN: 14 mg/dL (ref 7–25)
CO2: 27 mmol/L (ref 20–32)
Calcium: 9.6 mg/dL (ref 8.6–10.4)
Chloride: 106 mmol/L (ref 98–110)
Creat: 0.69 mg/dL (ref 0.60–0.93)
GFR, Est African American: 100 mL/min/{1.73_m2} (ref >=60)
GFR, Est Non African American: 86 mL/min/{1.73_m2} (ref >=60)
Globulin: 3.3 g/dL (calc) (ref 1.9–3.7)
Glucose, Bld: 141 mg/dL — ABNORMAL HIGH (ref 65–99)
Potassium: 4.5 mmol/L (ref 3.5–5.3)
Sodium: 143 mmol/L (ref 135–146)
Total Bilirubin: 0.3 mg/dL (ref 0.2–1.2)
Total Protein: 7.5 g/dL (ref 6.1–8.1)

## 2020-02-08 LAB — CBC WITH DIFFERENTIAL/PLATELET
Absolute Monocytes: 627 cells/uL (ref 200–950)
Basophils Absolute: 53 cells/uL (ref 0–200)
Basophils Relative: 0.8 %
Eosinophils Absolute: 112 cells/uL (ref 15–500)
Eosinophils Relative: 1.7 %
HCT: 38.6 % (ref 35.0–45.0)
Hemoglobin: 12.6 g/dL (ref 11.7–15.5)
Lymphs Abs: 2059 cells/uL (ref 850–3900)
MCH: 28.4 pg (ref 27.0–33.0)
MCHC: 32.6 g/dL (ref 32.0–36.0)
MCV: 87.1 fL (ref 80.0–100.0)
MPV: 13.1 fL — ABNORMAL HIGH (ref 7.5–12.5)
Monocytes Relative: 9.5 %
Neutro Abs: 3749 cells/uL (ref 1500–7800)
Neutrophils Relative %: 56.8 %
Platelets: 183 10*3/uL (ref 140–400)
RBC: 4.43 10*6/uL (ref 3.80–5.10)
RDW: 12.2 % (ref 11.0–15.0)
Total Lymphocyte: 31.2 %
WBC: 6.6 10*3/uL (ref 3.8–10.8)

## 2020-02-08 LAB — HEMOGLOBIN A1C
Hgb A1c MFr Bld: 7.2 % of total Hgb — ABNORMAL HIGH (ref <5.7)
Mean Plasma Glucose: 160 (calc)
eAG (mmol/L): 8.9 (calc)

## 2020-02-15 ENCOUNTER — Ambulatory Visit (INDEPENDENT_AMBULATORY_CARE_PROVIDER_SITE_OTHER): Payer: Medicare Other

## 2020-02-15 ENCOUNTER — Other Ambulatory Visit: Payer: Self-pay

## 2020-02-15 VITALS — BP 152/88 | HR 74 | Temp 98.1°F | Resp 20 | Wt 229.2 lb

## 2020-02-15 DIAGNOSIS — Z0000 Encounter for general adult medical examination without abnormal findings: Secondary | ICD-10-CM

## 2020-02-15 NOTE — Progress Notes (Signed)
Subjective:   Shelly Yoder is a 74 y.o. female who presents for Medicare Annual (Subsequent) preventive examination.  Review of Systems     Cardiac Risk Factors include: advanced age (>34men, >58 women);diabetes mellitus;dyslipidemia;hypertension;obesity (BMI >30kg/m2)     Objective:    Today's Vitals   02/15/20 0808 02/15/20 0836  BP: (!) 160/90 (!) 152/88  Pulse: 74   Resp: 20   Temp: 98.1 F (36.7 C)   SpO2: 95%   Weight: 229 lb 3.2 oz (104 kg)    Body mass index is 33.85 kg/m.  Advanced Directives 02/15/2020 01/18/2019 06/12/2015  Does Patient Have a Medical Advance Directive? Yes Yes Yes  Type of Paramedic of [place];Living will Living will;Healthcare Power of [place]  Does patient want to make changes to medical advance directive? - No - Patient declined -  Copy of [place] in Chart? No - copy requested No - copy requested -    Current Medications (verified) Outpatient Encounter Medications as of 02/15/2020  Medication Sig  . aspirin EC 81 MG tablet Take 81 mg by mouth.  . Cyanocobalamin (B-12 PO) Take by mouth.  Marland Kitchen glucose blood (ONETOUCH VERIO) test strip USE TO CHECK BLOOD SUGAR DAILY AND PRN  . lisinopril (ZESTRIL) 30 MG tablet Take 1 tablet (30 mg total) by mouth daily.  . Multiple Vitamin (MULTI-VITAMINS) TABS Take by mouth.  Glory Rosebush Delica Lancets 96V MISC USE TO CHECK BLOOD SUGAR DAILY AND PRN  . rosuvastatin (CRESTOR) 5 MG tablet Take 1 tablet (5 mg total) by mouth 3 (three) times a week.  . Sodium Sulfate-Mag Sulfate-KCl (SUTAB) [phone] MG TABS Take 1 kit by mouth as directed. (Patient not taking: Reported on 02/15/2020)  . Vitamin D, Ergocalciferol, (DRISDOL) 1.25 MG (50000 UNIT) CAPS capsule TAKE ONE CAPSULE BY MOUTH ONCE WEEKLY FOR 8 WEEKS THEN TAKE 1000 IU/DAY (Patient not taking: Reported on 02/15/2020)  . Vitamin D, Ergocalciferol, (DRISDOL) 1.25 MG (50000 UNIT)  CAPS capsule One capsule by mouth once a week for 12 weeks. Then take 2000IU/day (Patient not taking: Reported on 02/15/2020)   No facility-administered encounter medications on file as of 02/15/2020.    Allergies (verified) Atorvastatin and Hydrocodone-acetaminophen   History: Past Medical History:  Diagnosis Date  . Anemia   . Anxiety    situational - no meds  . Arthritis    possibly   . Bilateral hearing loss 03/29/2018   no hearing aids  . Cataracts, bilateral    forming both eyes - just watching  . Colon polyps   . Depression    no meds  . Diabetes mellitus without complication ([place]) 0258    Diet controlled  no meds  . Heart murmur    no problems  . Hyperlipidemia   . Hyperlipidemia associated with type 2 diabetes mellitus ([place]) 03/29/2018  . Hypertension    no meds, working on diet.weight loss  . Hypertension associated with diabetes ([place]) 03/29/2018  . Lipoma   . Lipoma of torso 03/29/2018  . Refusal of blood transfusions as patient is Jehovah's Witness 12/04/2014  . Type 2 diabetes mellitus with hyperglycemia, without long-term current use of insulin ([place]) 03/29/2018  . Vitamin D deficiency 03/29/2018   Past Surgical History:  Procedure Laterality Date  . ABDOMINAL HYSTERECTOMY  1996   partial   . BREAST BIOPSY Right   . COLONOSCOPY  06/12/2015  . LIPOMA EXCISION     upper back  . POLYPECTOMY  Family History  Problem Relation Age of Onset  . Diabetes Mother   . Heart attack Mother   . Hyperlipidemia Mother   . Heart disease Mother 78  . Heart block Mother   . Kidney disease Mother   . Alcohol abuse Father   . Diabetes Father   . Heart disease Father   . Kidney disease Father   . Breast cancer Sister   . Diabetes Brother   . Hypertension Brother   . Kidney disease Brother   . Colon cancer Neg Hx   . Esophageal cancer Neg Hx   . Stomach cancer Neg Hx   . Rectal cancer Neg Hx   . Colon polyps Neg Hx    Social History   Socioeconomic History   . Marital status: Married    Spouse name: Not on file  . Number of children: 3  . Years of education: Not on file  . Highest education level: Not on file  Occupational History  . Occupation: Retired    Comment: Publishing rights manager   Tobacco Use  . Smoking status: Former Smoker    Types: Cigarettes    Quit date: 06/15/1975    Years since quitting: 44.7  . Smokeless tobacco: Never Used  Vaping Use  . Vaping Use: Never used  Substance and Sexual Activity  . Alcohol use: Yes    Alcohol/week: 0.0 standard drinks    Comment: occasionally Wine/Beer  . Drug use: No  . Sexual activity: Not Currently    Birth control/protection: Surgical    Comment: Hysterectomy  Other Topics Concern  . Not on file  Social History Narrative  . Not on file   Social Determinants of Health   Financial Resource Strain: Low Risk   . Difficulty of Paying Living Expenses: Not hard at all  Food Insecurity: No Food Insecurity  . Worried About Charity fundraiser in the Last Year: Never true  . Ran Out of Food in the Last Year: Never true  Transportation Needs: No Transportation Needs  . Lack of Transportation (Medical): No  . Lack of Transportation (Non-Medical): No  Physical Activity: Inactive  . Days of Exercise per Week: 0 days  . Minutes of Exercise per Session: 0 min  Stress: No Stress Concern Present  . Feeling of Stress : Not at all  Social Connections: Moderately Integrated  . Frequency of Communication with Friends and Family: More than three times a week  . Frequency of Social Gatherings with Friends and Family: Once a week  . Attends Religious Services: More than 4 times per year  . Active Member of Clubs or Organizations: No  . Attends Archivist Meetings: Never  . Marital Status: Married    Tobacco Counseling Counseling given: Not Answered   Clinical Intake:  Pre-visit preparation completed: Yes  Pain : No/denies pain     BMI - recorded: 33.85 Nutritional Status:  BMI > 30  Obese Nutritional Risks: None Diabetes: Yes CBG done?: No Did pt. bring in CBG monitor from home?: No  How often do you need to have someone help you when you read instructions, pamphlets, or other written materials from your doctor or pharmacy?: 1 - Never  Diabetic?Nutrition Risk Assessment:  Has the patient had any N/V/D within the last 2 months?  No  Does the patient have any non-healing wounds?  No  Has the patient had any unintentional weight loss or weight gain?  No   Diabetes:  Is the patient diabetic?  Yes  If diabetic, was a CBG obtained today?  No  Did the patient bring in their glucometer from home?  No  How often do you monitor your CBG's? daily.   Financial Strains and Diabetes Management:  Are you having any financial strains with the device, your supplies or your medication? No .  Does the patient want to be seen by Chronic Care Management for management of their diabetes?  No  Would the patient like to be referred to a Nutritionist or for Diabetic Management?  No   Diabetic Exams:  Diabetic Eye Exam: Overdue for diabetic eye exam. Pt has been advised about the importance in completing this exam. Patient advised to call and schedule an eye exam. Diabetic Foot Exam: Completed 06/21/19   Interpreter Needed?: No  Information entered by :: Charlott Rakes, LPN   Activities of Daily Living In your present state of health, do you have any difficulty performing the following activities: 02/15/2020  Hearing? Y  Comment mild loss and ringing in ear  Vision? N  Difficulty concentrating or making decisions? N  Dressing or bathing? N  Doing errands, shopping? N  Preparing Food and eating ? N  Using the Toilet? N  In the past six months, have you accidently leaked urine? N  Do you have problems with loss of bowel control? N  Managing your Medications? N  Managing your Finances? N  Housekeeping or managing your Housekeeping? N  Some recent data might be  hidden    Patient Care Team: Orma Flaming, MD as PCP - General (Family Medicine) Ladene Artist, MD as Consulting Physician (Gastroenterology) Leta Baptist, MD as Consulting Physician (Otolaryngology) [place], P.A. as Consulting Physician Allyn Kenner, MD as Consulting Physician (Dermatology)  Indicate any recent Medical Services you may have received from other than [facility] providers in the past year (date may be approximate).     Assessment:   This is a routine wellness examination for [facility].  Hearing/Vision screen  Hearing Screening   '125Hz'$  $Remo'250Hz'QdDHa$'500Hz'$'1000Hz'$'2000Hz'$'3000Hz'$'4000Hz'$'6000Hz'$'8000Hz'$   Right ear:           Left ear:           Comments: Some hearing loss has a constant ringing at times  Vision Screening Comments: Pt follows Dr Katy Fitch annually for eye exams  Dietary issues and exercise activities discussed: Current Exercise Habits: The patient does not participate in regular exercise at present  Goals    . Patient Stated     Maintain current state of health and remain Covid free     . Patient Stated     Manage diet better to get weight down       Depression Screen PHQ 2/9 Scores 02/15/2020 02/07/2020 07/19/2019 02/16/2019 01/18/2019 04/11/2018 03/27/2018  PHQ - 2 Score 0 0 0 0 0 1 1  PHQ- 9 Score - - - - - 3 4    Fall Risk Fall Risk  02/15/2020 02/07/2020 11/08/2019 07/19/2019 01/18/2019  Falls in the past year? 0 0 0 0 0  Number falls in past yr: 0 - - - -  Injury with Fall? 0 - - - 0  Risk for fall due to : Impaired vision;Impaired mobility No Fall Risks No Fall Risks No Fall Risks -  Follow up Falls prevention discussed - - - Falls evaluation completed;Education provided;Falls prevention discussed    FALL RISK PREVENTION PERTAINING TO THE HOME:  Any stairs in or around the home? Yes  If so, are there any without handrails? No  Home free of loose throw rugs in walkways, pet beds, electrical cords, etc? Yes  Adequate lighting in your home to reduce  risk of falls? Yes   ASSISTIVE DEVICES UTILIZED TO PREVENT FALLS:  Life alert? No  Use of a cane, walker or w/c? No  Grab bars in the bathroom? No  Shower chair or bench in shower? No  Elevated toilet seat or a handicapped toilet? No   TIMED UP AND GO:  Was the test performed? Yes .  Length of time to ambulate 10 feet: 10 sec.   Gait slow and steady without use of assistive device  Cognitive Function:     6CIT Screen 02/15/2020  What Year? 0 points  What month? 0 points  Count back from 20 0 points  Months in reverse 0 points  Repeat phrase 0 points    Immunizations Immunization History  Administered Date(s) Administered  . PFIZER SARS-COV-2 Vaccination 04/12/2019, 05/07/2019, 11/13/2019    TDAP status: Due, Education has been provided regarding the importance of this vaccine. Advised may receive this vaccine at local pharmacy or Health Dept. Aware to provide a copy of the vaccination record if obtained from local pharmacy or Health Dept. Verbalized acceptance and understanding.  Flu Vaccine status: Declined, Education has been provided regarding the importance of this vaccine but patient still declined. Advised may receive this vaccine at local pharmacy or Health Dept. Aware to provide a copy of the vaccination record if obtained from local pharmacy or Health Dept. Verbalized acceptance and understanding.  Pneumococcal vaccine status: Declined,  Education has been provided regarding the importance of this vaccine but patient still declined. Advised may receive this vaccine at local pharmacy or Health Dept. Aware to provide a copy of the vaccination record if obtained from local pharmacy or Health Dept. Verbalized acceptance and understanding.   Covid-19 vaccine status: Completed vaccines  Qualifies for Shingles Vaccine? Yes   Zostavax completed No   Shingrix Completed?: No.    Education has been provided regarding the importance of this vaccine. Patient has been advised  to call insurance company to determine out of pocket expense if they have not yet received this vaccine. Advised may also receive vaccine at local pharmacy or Health Dept. Verbalized acceptance and understanding.  Screening Tests Health Maintenance  Topic Date Due  . DEXA SCAN  Never done  . OPHTHALMOLOGY EXAM  04/04/2020 (Originally 01/20/2019)  . INFLUENZA VACCINE  06/05/2020 (Originally 10/07/2019)  . TETANUS/TDAP  02/06/2021 (Originally [birth year]1965)  . PNA vac Low Risk Adult (1 of 2 - PCV13) 02/06/2021 (Originally 05/03/2011)  . FOOT EXAM  06/20/2020  . HEMOGLOBIN A1C  08/07/2020  . MAMMOGRAM  03/21/2021  . COLONOSCOPY  07/15/2021  . COVID-19 Vaccine  Completed  . Hepatitis C Screening  Completed    Health Maintenance  Health Maintenance Due  Topic Date Due  . DEXA SCAN  Never done    Colorectal cancer screening: Type of screening: Colonoscopy. Completed 07/16/19. Repeat every 2 years  Mammogram status: Completed 03/22/19. Repeat every year  Bone Density status: Ordered 02/15/20. Pt provided with contact info and advised to call to schedule appt.   Additional Screening:  Hepatitis C Screening:  Completed 01/23/16  Vision Screening: Recommended annual ophthalmology exams for early detection of glaucoma and other disorders of the eye. Is the patient up to date with their annual eye exam?  Yes  Who is the provider or what is the name of  the office in which the patient attends annual eye exams? Dr Katy Fitch   Dental Screening: Recommended annual dental exams for proper oral hygiene  Community Resource Referral / Chronic Care Management: CRR required this visit?  No   CCM required this visit?  No      Plan:     I have personally reviewed and noted the following in the patient's chart:   . Medical and social history . Use of alcohol, tobacco or illicit drugs  . Current medications and supplements . Functional ability and status . Nutritional status . Physical  activity . Advanced directives . List of other physicians . Hospitalizations, surgeries, and ER visits in previous 12 months . Vitals . Screenings to include cognitive, depression, and falls . Referrals and appointments  In addition, I have reviewed and discussed with patient certain preventive protocols, quality metrics, and best practice recommendations. A written personalized care plan for preventive services as well as general preventive health recommendations were provided to patient.     Willette Brace, LPN   16/12/9602   Nurse Notes: Pt Bp was 160/90 and recheck was 152/88 pt stated she is not consistent with taking medication as as prescribed. Denies any pain or discomfort

## 2020-02-15 NOTE — Patient Instructions (Signed)
Shelly Yoder , Thank you for taking time to come for your Medicare Wellness Visit. I appreciate your ongoing commitment to your health goals. Please review the following plan we discussed and let me know if I can assist you in the future.   Screening recommendations/referrals: Colonoscopy: Done 07/16/19 Mammogram: Done 03/22/19 Bone Density: Ordered previously call to set up appt Recommended yearly ophthalmology/optometry visit for glaucoma screening and checkup Recommended yearly dental visit for hygiene and checkup  Vaccinations: Influenza vaccine: Declined and discussed Pneumococcal vaccine: Declined and discussed Tdap vaccine: Declined and discussed  Shingles vaccine: Shingrix discussed. Please contact your pharmacy for coverage information.    Covid-19:Complted 2/4, 3/1 & 97/21  Advanced directives: Please bring a copy of your health care power of attorney and living will to the office at your convenience.  Conditions/risks identified: Maintain diet   Next appointment: Follow up in one year for your annual wellness visit   Preventive Care 65 Years and Older, Female Preventive care refers to lifestyle choices and visits with your health care provider that can promote health and wellness. What does preventive care include?  A yearly physical exam. This is also called an annual well check.  Dental exams once or twice a year.  Routine eye exams. Ask your health care provider how often you should have your eyes checked.  Personal lifestyle choices, including:  Daily care of your teeth and gums.  Regular physical activity.  Eating a healthy diet.  Avoiding tobacco and drug use.  Limiting alcohol use.  Practicing safe sex.  Taking low-dose aspirin every day.  Taking vitamin and mineral supplements as recommended by your health care provider. What happens during an annual well check? The services and screenings done by your health care provider during your annual well check  will depend on your age, overall health, lifestyle risk factors, and family history of disease. Counseling  Your health care provider may ask you questions about your:  Alcohol use.  Tobacco use.  Drug use.  Emotional well-being.  Home and relationship well-being.  Sexual activity.  Eating habits.  History of falls.  Memory and ability to understand (cognition).  Work and work Statistician.  Reproductive health. Screening  You may have the following tests or measurements:  Height, weight, and BMI.  Blood pressure.  Lipid and cholesterol levels. These may be checked every 5 years, or more frequently if you are over 25 years old.  Skin check.  Lung cancer screening. You may have this screening every year starting at age 69 if you have a 30-pack-year history of smoking and currently smoke or have quit within the past 15 years.  Fecal occult blood test (FOBT) of the stool. You may have this test every year starting at age 74.  Flexible sigmoidoscopy or colonoscopy. You may have a sigmoidoscopy every 5 years or a colonoscopy every 10 years starting at age 55.  Hepatitis C blood test.  Hepatitis B blood test.  Sexually transmitted disease (STD) testing.  Diabetes screening. This is done by checking your blood sugar (glucose) after you have not eaten for a while (fasting). You may have this done every 1-3 years.  Bone density scan. This is done to screen for osteoporosis. You may have this done starting at age 61.  Mammogram. This may be done every 1-2 years. Talk to your health care provider about how often you should have regular mammograms. Talk with your health care provider about your test results, treatment options, and if necessary, the  need for more tests. Vaccines  Your health care provider may recommend certain vaccines, such as:  Influenza vaccine. This is recommended every year.  Tetanus, diphtheria, and acellular pertussis (Tdap, Td) vaccine. You may  need a Td booster every 10 years.  Zoster vaccine. You may need this after age 64.  Pneumococcal 13-valent conjugate (PCV13) vaccine. One dose is recommended after age 22.  Pneumococcal polysaccharide (PPSV23) vaccine. One dose is recommended after age 24. Talk to your health care provider about which screenings and vaccines you need and how often you need them. This information is not intended to replace advice given to you by your health care provider. Make sure you discuss any questions you have with your health care provider. Document Released: 03/21/2015 Document Revised: 11/12/2015 Document Reviewed: 12/24/2014 Elsevier Interactive Patient Education  2017 [place] Prevention in the Home Falls can cause injuries. They can happen to people of all ages. There are many things you can do to make your home safe and to help prevent falls. What can I do on the outside of my home?  Regularly fix the edges of walkways and driveways and fix any cracks.  Remove anything that might make you trip as you walk through a door, such as a raised step or threshold.  Trim any bushes or trees on the path to your home.  Use bright outdoor lighting.  Clear any walking paths of anything that might make someone trip, such as rocks or tools.  Regularly check to see if handrails are loose or broken. Make sure that both sides of any steps have handrails.  Any raised decks and porches should have guardrails on the edges.  Have any leaves, snow, or ice cleared regularly.  Use sand or salt on walking paths during winter.  Clean up any spills in your garage right away. This includes oil or grease spills. What can I do in the bathroom?  Use night lights.  Install grab bars by the toilet and in the tub and shower. Do not use towel bars as grab bars.  Use non-skid mats or decals in the tub or shower.  If you need to sit down in the shower, use a plastic, non-slip stool.  Keep the floor  dry. Clean up any water that spills on the floor as soon as it happens.  Remove soap buildup in the tub or shower regularly.  Attach bath mats securely with double-sided non-slip rug tape.  Do not have throw rugs and other things on the floor that can make you trip. What can I do in the bedroom?  Use night lights.  Make sure that you have a light by your bed that is easy to reach.  Do not use any sheets or blankets that are too big for your bed. They should not hang down onto the floor.  Have a firm chair that has side arms. You can use this for support while you get dressed.  Do not have throw rugs and other things on the floor that can make you trip. What can I do in the kitchen?  Clean up any spills right away.  Avoid walking on wet floors.  Keep items that you use a lot in easy-to-reach places.  If you need to reach something above you, use a strong step stool that has a grab bar.  Keep electrical cords out of the way.  Do not use floor polish or wax that makes floors slippery. If you must use wax,  use non-skid floor wax.  Do not have throw rugs and other things on the floor that can make you trip. What can I do with my stairs?  Do not leave any items on the stairs.  Make sure that there are handrails on both sides of the stairs and use them. Fix handrails that are broken or loose. Make sure that handrails are as long as the stairways.  Check any carpeting to make sure that it is firmly attached to the stairs. Fix any carpet that is loose or worn.  Avoid having throw rugs at the top or bottom of the stairs. If you do have throw rugs, attach them to the floor with carpet tape.  Make sure that you have a light switch at the top of the stairs and the bottom of the stairs. If you do not have them, ask someone to add them for you. What else can I do to help prevent falls?  Wear shoes that:  Do not have high heels.  Have rubber bottoms.  Are comfortable and fit you  well.  Are closed at the toe. Do not wear sandals.  If you use a stepladder:  Make sure that it is fully opened. Do not climb a closed stepladder.  Make sure that both sides of the stepladder are locked into place.  Ask someone to hold it for you, if possible.  Clearly mark and make sure that you can see:  Any grab bars or handrails.  First and last steps.  Where the edge of each step is.  Use tools that help you move around (mobility aids) if they are needed. These include:  Canes.  Walkers.  Scooters.  Crutches.  Turn on the lights when you go into a dark area. Replace any light bulbs as soon as they burn out.  Set up your furniture so you have a clear path. Avoid moving your furniture around.  If any of your floors are uneven, fix them.  If there are any pets around you, be aware of where they are.  Review your medicines with your doctor. Some medicines can make you feel dizzy. This can increase your chance of falling. Ask your doctor what other things that you can do to help prevent falls. This information is not intended to replace advice given to you by your health care provider. Make sure you discuss any questions you have with your health care provider. Document Released: 12/19/2008 Document Revised: 07/31/2015 Document Reviewed: 03/29/2014 Elsevier Interactive Patient Education  2017 [employer].

## 2020-04-10 DIAGNOSIS — H52202 Unspecified astigmatism, left eye: Secondary | ICD-10-CM | POA: Diagnosis not present

## 2020-04-10 DIAGNOSIS — E119 Type 2 diabetes mellitus without complications: Secondary | ICD-10-CM | POA: Diagnosis not present

## 2020-04-10 DIAGNOSIS — H16223 Keratoconjunctivitis sicca, not specified as Sjogren's, bilateral: Secondary | ICD-10-CM | POA: Diagnosis not present

## 2020-04-10 DIAGNOSIS — H5213 Myopia, bilateral: Secondary | ICD-10-CM | POA: Diagnosis not present

## 2020-04-10 DIAGNOSIS — H2513 Age-related nuclear cataract, bilateral: Secondary | ICD-10-CM | POA: Diagnosis not present

## 2020-04-10 DIAGNOSIS — H524 Presbyopia: Secondary | ICD-10-CM | POA: Diagnosis not present

## 2020-04-15 ENCOUNTER — Other Ambulatory Visit: Payer: Self-pay | Admitting: Family Medicine

## 2020-04-15 DIAGNOSIS — Z1231 Encounter for screening mammogram for malignant neoplasm of breast: Secondary | ICD-10-CM

## 2020-06-03 ENCOUNTER — Ambulatory Visit
Admission: RE | Admit: 2020-06-03 | Discharge: 2020-06-03 | Disposition: A | Discharge status: Home / Self Care | Payer: Medicare Other | Source: Ambulatory Visit | Attending: Family Medicine | Admitting: Family Medicine

## 2020-06-03 ENCOUNTER — Other Ambulatory Visit: Payer: Self-pay

## 2020-06-03 DIAGNOSIS — Z1231 Encounter for screening mammogram for malignant neoplasm of breast: Secondary | ICD-10-CM

## 2020-06-05 ENCOUNTER — Ambulatory Visit: Payer: Medicare Other | Admitting: Podiatry

## 2020-06-05 ENCOUNTER — Other Ambulatory Visit: Payer: Self-pay

## 2020-06-05 ENCOUNTER — Encounter: Payer: Self-pay | Admitting: Podiatry

## 2020-06-05 DIAGNOSIS — E1165 Type 2 diabetes mellitus with hyperglycemia: Secondary | ICD-10-CM

## 2020-06-05 DIAGNOSIS — M2011 Hallux valgus (acquired), right foot: Secondary | ICD-10-CM | POA: Diagnosis not present

## 2020-06-05 DIAGNOSIS — M21612 Bunion of left foot: Secondary | ICD-10-CM | POA: Diagnosis not present

## 2020-06-05 DIAGNOSIS — Q828 Other specified congenital malformations of skin: Secondary | ICD-10-CM

## 2020-06-05 DIAGNOSIS — M2012 Hallux valgus (acquired), left foot: Secondary | ICD-10-CM | POA: Diagnosis not present

## 2020-06-05 DIAGNOSIS — M21611 Bunion of right foot: Secondary | ICD-10-CM

## 2020-06-05 NOTE — Progress Notes (Signed)
  Subjective:  Patient ID: Shelly Yoder, female    DOB: 07-09-1945,  MRN: [id number]  Chief Complaint  Patient presents with  . Diabetes    Diabetic foot exam Callus     75 y.o. female presents with the above complaint. History confirmed with patient.  She has type 2 diabetes which is well controlled.  She has multiple skin lesions that are painful and uncomfortable for her  Objective:  Physical Exam: warm, good capillary refill, no trophic changes or ulcerative lesions, normal DP and PT pulses, normal monofilament exam and normal sensory exam.  Multiple porokeratosis, 4 on the left foot submetatarsal 2, 3, 4 and on the hallux and 3 on the right foot submetatarsal 2 3 and 4 Assessment:   1. Porokeratosis   2. Hallux valgus with bunions, left   3. Hallux valgus with bunions, right   4. Type 2 diabetes mellitus with hyperglycemia, without long-term current use of insulin ([place])      Plan:  Patient was evaluated and treated and all questions answered.  Patient educated on diabetes. Discussed proper diabetic foot care and discussed risks and complications of disease. Educated patient in depth on reasons to return to the office immediately should he/she discover anything concerning or new on the feet. All questions answered. Discussed proper shoes as well.   All symptomatic hyperkeratoses were safely debrided with a sterile #15 blade to patient's level of comfort without incident. We discussed preventative and palliative care of these lesions including supportive and accommodative shoegear, padding, prefabricated and custom molded accommodative orthoses, use of a pumice stone and lotions/creams daily. I recommended she use urea cream and she got this from our office today  Return if symptoms worsen or fail to improve.

## 2020-06-05 NOTE — Patient Instructions (Addendum)
Look for urea 40% cream or ointment and apply to the thickened dry skin / calluses. This can be bought over the counter, at a pharmacy or online such as [employer].  Corns and Calluses Corns are small areas of thickened skin that form on the top, sides, or tip of a toe. Corns have a cone-shaped core with a point that can press on a nerve below. This causes pain. Calluses are areas of thickened skin that can form anywhere on the body, including the hands, fingers, palms, soles of the feet, and heels. Calluses are usually larger than corns. What are the causes? Corns and calluses are caused by rubbing (friction) or pressure, such as from shoes that are too tight or do not fit properly. What increases the risk? Corns are more likely to develop in people who have misshapen toes (toe deformities), such as hammer toes. Calluses can form with friction to any area of the skin. They are more likely to develop in people who:  Work with their hands.  Wear shoes that fit poorly, are too tight, or are high-heeled.  Have toe deformities. What are the signs or symptoms? Symptoms of a corn or callus include:  A hard growth on the skin.  Pain or tenderness under the skin.  Redness and swelling.  Increased discomfort while wearing tight-fitting shoes, if your feet are affected. If a corn or callus becomes infected, symptoms may include:  Redness and swelling that gets worse.  Pain.  Fluid, blood, or pus draining from the corn or callus.   How is this diagnosed? Corns and calluses may be diagnosed based on your symptoms, your medical history, and a physical exam. How is this treated? Treatment for corns and calluses may include:  Removing the cause of the friction or pressure. This may involve: ? Changing your shoes. ? Wearing shoe inserts (orthotics) or other protective layers in your shoes, such as a corn pad. ? Wearing gloves.  Applying medicine to the skin (topical medicine) to help soften  skin in the hardened, thickened areas.  Removing layers of dead skin with a file to reduce the size of the corn or callus.  Removing the corn or callus with a scalpel or laser.  Taking antibiotic medicines, if your corn or callus is infected.  Having surgery, if a toe deformity is the cause. Follow these instructions at home:  Take over-the-counter and prescription medicines only as told by your health care provider.  If you were prescribed an antibiotic medicine, take it as told by your health care provider. Do not stop taking it even if your condition improves.  Wear shoes that fit well. Avoid wearing high-heeled shoes and shoes that are too tight or too loose.  Wear any padding, protective layers, gloves, or orthotics as told by your health care provider.  Soak your hands or feet. Then use a file or pumice stone to soften your corn or callus. Do this as told by your health care provider.  Check your corn or callus every day for signs of infection.   Contact a health care provider if:  Your symptoms do not improve with treatment.  You have redness or swelling that gets worse.  Your corn or callus becomes painful.  You have fluid, blood, or pus coming from your corn or callus.  You have new symptoms. Get help right away if:  You develop severe pain with redness. Summary  Corns are small areas of thickened skin that form on the top, sides,  or tip of a toe. These can be painful.  Calluses are areas of thickened skin that can form anywhere on the body, including the hands, fingers, palms, and soles of the feet. Calluses are usually larger than corns.  Corns and calluses are caused by rubbing (friction) or pressure, such as from shoes that are too tight or do not fit properly.  Treatment may include wearing padding, protective layers, gloves, or orthotics as told by your health care provider. This information is not intended to replace advice given to you by your health care  provider. Make sure you discuss any questions you have with your health care provider. Document Revised: 06/21/2019 Document Reviewed: 06/21/2019 Elsevier Patient Education  2021 [employer].

## 2020-07-25 ENCOUNTER — Other Ambulatory Visit: Payer: Self-pay

## 2020-07-25 ENCOUNTER — Encounter: Payer: Self-pay | Admitting: Family Medicine

## 2020-07-25 ENCOUNTER — Ambulatory Visit (INDEPENDENT_AMBULATORY_CARE_PROVIDER_SITE_OTHER): Payer: Medicare Other | Admitting: Family Medicine

## 2020-07-25 VITALS — BP 170/90 | HR 76 | Temp 97.1°F | Ht 69.0 in | Wt 220.4 lb

## 2020-07-25 DIAGNOSIS — E785 Hyperlipidemia, unspecified: Secondary | ICD-10-CM

## 2020-07-25 DIAGNOSIS — E1169 Type 2 diabetes mellitus with other specified complication: Secondary | ICD-10-CM

## 2020-07-25 DIAGNOSIS — I152 Hypertension secondary to endocrine disorders: Secondary | ICD-10-CM | POA: Diagnosis not present

## 2020-07-25 DIAGNOSIS — E1165 Type 2 diabetes mellitus with hyperglycemia: Secondary | ICD-10-CM | POA: Diagnosis not present

## 2020-07-25 DIAGNOSIS — E1159 Type 2 diabetes mellitus with other circulatory complications: Secondary | ICD-10-CM

## 2020-07-25 DIAGNOSIS — E559 Vitamin D deficiency, unspecified: Secondary | ICD-10-CM

## 2020-07-25 LAB — COMPREHENSIVE METABOLIC PANEL
ALT: 12 U/L (ref 0–35)
AST: 19 U/L (ref 0–37)
Albumin: 4.2 g/dL (ref 3.5–5.2)
Alkaline Phosphatase: 68 U/L (ref 39–117)
BUN: 11 mg/dL (ref 6–23)
CO2: 30 mEq/L (ref 19–32)
Calcium: 9.7 mg/dL (ref 8.4–10.5)
Chloride: 105 mEq/L (ref 96–112)
Creatinine, Ser: 0.73 mg/dL (ref 0.40–1.20)
GFR: 81.12 mL/min (ref >60.00)
Glucose, Bld: 122 mg/dL — ABNORMAL HIGH (ref 70–99)
Potassium: 4.3 mEq/L (ref 3.5–5.1)
Sodium: 141 mEq/L (ref 135–145)
Total Bilirubin: 0.5 mg/dL (ref 0.2–1.2)
Total Protein: 7.6 g/dL (ref 6.0–8.3)

## 2020-07-25 LAB — CBC WITH DIFFERENTIAL/PLATELET
Basophils Absolute: 0.1 10*3/uL (ref 0.0–0.1)
Basophils Relative: 0.9 % (ref 0.0–3.0)
Eosinophils Absolute: 0.1 10*3/uL (ref 0.0–0.7)
Eosinophils Relative: 1.9 % (ref 0.0–5.0)
HCT: 39.1 % (ref 36.0–46.0)
Hemoglobin: 12.7 g/dL (ref 12.0–15.0)
Lymphocytes Relative: 31.4 % (ref 12.0–46.0)
Lymphs Abs: 1.9 10*3/uL (ref 0.7–4.0)
MCHC: 32.6 g/dL (ref 30.0–36.0)
MCV: 86.5 fl (ref 78.0–100.0)
Monocytes Absolute: 0.6 10*3/uL (ref 0.1–1.0)
Monocytes Relative: 9.8 % (ref 3.0–12.0)
Neutro Abs: 3.3 10*3/uL (ref 1.4–7.7)
Neutrophils Relative %: 56 % (ref 43.0–77.0)
Platelets: 183 10*3/uL (ref 150.0–400.0)
RBC: 4.53 Mil/uL (ref 3.87–5.11)
RDW: 13.9 % (ref 11.5–15.5)
WBC: 6 10*3/uL (ref 4.0–10.5)

## 2020-07-25 LAB — VITAMIN D 25 HYDROXY (VIT D DEFICIENCY, FRACTURES): VITD: 28.93 ng/mL — ABNORMAL LOW (ref 30.00–100.00)

## 2020-07-25 LAB — LIPID PANEL
Cholesterol: 264 mg/dL — ABNORMAL HIGH (ref 0–200)
HDL: 62.7 mg/dL (ref >39.00)
LDL Cholesterol: 182 mg/dL — ABNORMAL HIGH (ref 0–99)
NonHDL: 201.46
Total CHOL/HDL Ratio: 4
Triglycerides: 99 mg/dL (ref 0.0–149.0)
VLDL: 19.8 mg/dL (ref 0.0–40.0)

## 2020-07-25 LAB — MICROALBUMIN / CREATININE URINE RATIO
Creatinine,U: 204.7 mg/dL
Microalb Creat Ratio: 1.2 mg/g (ref 0.0–30.0)
Microalb, Ur: 2.5 mg/dL — ABNORMAL HIGH (ref 0.0–1.9)

## 2020-07-25 LAB — HEMOGLOBIN A1C: Hgb A1c MFr Bld: 7.1 % — ABNORMAL HIGH (ref 4.6–6.5)

## 2020-07-25 NOTE — Patient Instructions (Signed)
-  blood pressure is high, I still think you need medication. When you run this high it just puts you more at risk for heart attack or stroke especially with diabetes.   -keep taking vitamin D! Will check today  -so proud of your weight loss! im praying your diabetes Is on goal! Keep up the good work!   - I do think you need cholesterol drug, your cholesterol is really high!!!   Will check everything today!  Keep up the good work! Will miss taking care of you.   D.r Rogers Blocker

## 2020-07-25 NOTE — Progress Notes (Signed)
Patient: Shelly Yoder MRN: [id number] DOB: [birth year] PCP: Orma Flaming, MD     Subjective:  Chief Complaint  Patient presents with  . Hypertension       . Hyperlipidemia  . Diabetes  . Vitamin D Deficiency    HPI: The patient is a 75 y.o. female who presents today for HTN, Hyperlipidemia, DM, Vitamin D Def.  Hypertension: Here for follow up of hypertension.  Currently on lisinopril 30mg . She does not take her medication as prescribed as she doesn't like it. She has not taken her blood pressure pill in months. Home readings range from  381-017 PZWCHENI/77-82 diastolic.  Exercise includes walking. Weight has been decreasing slowly.  Denies any chest pain, headaches, shortness of breath, vision changes, swelling in lower extremities.   Diabetes A1c was 7.2 on our last visit 3 months ago. She really wanted to work on diet and exercise for 3 months before initiating medication. She has changed her diet, but has limited exercise. Does not check sugars at home.  She has lost 17 pounds since last year.   Hyperlipidemia I put her on crestor, but she is not taking at all. She states she thinks it made her legs cramp. Her LDL was 199 and discussed with her co morbidities, needs to be on statin.  The 10-year ASCVD risk score Mikey Bussing DC Jr., et al., 2013) is: 55.5%  Vitamin D deficiency Was very low last time we checked. She didn't pick up the vitamin D or start this. She does take vitamin D daily but can't remember her dosage.   Declines pneumonia shot.   Review of Systems  Constitutional: Negative for appetite change, chills, fatigue and fever.  HENT: Negative for dental problem, ear pain, hearing loss and trouble swallowing.   Eyes: Negative for visual disturbance.  Respiratory: Negative for cough, chest tightness and shortness of breath.   Cardiovascular: Negative for chest pain, palpitations and leg swelling.  Gastrointestinal: Negative for abdominal pain, blood in stool,  diarrhea and nausea.  Endocrine: Negative for cold intolerance, polydipsia, polyphagia and polyuria.  Genitourinary: Negative for dysuria and hematuria.  Musculoskeletal: Negative for arthralgias.  Skin: Negative for rash.  Neurological: Negative for dizziness and headaches.  Psychiatric/Behavioral: Negative for dysphoric mood and sleep disturbance. The patient is not nervous/anxious.     Allergies Patient is allergic to atorvastatin and hydrocodone-acetaminophen.  Past Medical History Patient  has a past medical history of Anemia, Anxiety, Arthritis, Bilateral hearing loss (03/29/2018), Cataracts, bilateral, Colon polyps, Depression, Diabetes mellitus without complication ([place]) (4235), Heart murmur, Hyperlipidemia, Hyperlipidemia associated with type 2 diabetes mellitus ([place]) (03/29/2018), Hypertension, Hypertension associated with diabetes ([place]) (03/29/2018), Lipoma, Lipoma of torso (03/29/2018), Refusal of blood transfusions as patient is Jehovah's Witness (12/04/2014), Type 2 diabetes mellitus with hyperglycemia, without long-term current use of insulin ([place]) (03/29/2018), and Vitamin D deficiency (03/29/2018).  Surgical History Patient  has a past surgical history that includes Lipoma excision; Abdominal hysterectomy (1996); Polypectomy; Breast biopsy (Right); and Colonoscopy (06/12/2015).  Family History Pateint's family history includes Alcohol abuse in her father; Breast cancer in her sister; Diabetes in her brother, father, and mother; Heart attack in her mother; Heart block in her mother; Heart disease in her father; Heart disease (age of onset: 47) in her mother; Hyperlipidemia in her mother; Hypertension in her brother; Kidney disease in her brother, father, and mother.  Social History Patient  reports that she quit smoking about 45 years ago. Her smoking use included cigarettes. She has never used smokeless tobacco.  She reports current alcohol use. She reports that she does not use  drugs.    Objective: Vitals:   07/25/20 0916 07/25/20 1003  BP: (!) 146/78 (!) 170/90  Pulse: 76   Temp: (!) 97.1 F (36.2 C)   TempSrc: Temporal   SpO2: 99%   Weight: 220 lb 6.4 oz (100 kg)   Height: 5\' 9"  (1.753 m)     Body mass index is 32.55 kg/m.  Physical Exam Vitals reviewed.  Constitutional:      Appearance: She is well-developed.  HENT:     Right Ear: External ear normal.     Left Ear: External ear normal.  Eyes:     Conjunctiva/sclera: Conjunctivae normal.     Pupils: Pupils are equal, round, and reactive to light.  Neck:     Thyroid: No thyromegaly.  Cardiovascular:     Rate and Rhythm: Normal rate and regular rhythm.     Heart sounds: Normal heart sounds. No murmur heard.   Pulmonary:     Effort: Pulmonary effort is normal.     Breath sounds: Normal breath sounds.  Abdominal:     General: Bowel sounds are normal. There is no distension.     Palpations: Abdomen is soft.     Tenderness: There is no abdominal tenderness.  Musculoskeletal:     Cervical back: Normal range of motion and neck supple.  Lymphadenopathy:     Cervical: No cervical adenopathy.  Skin:    General: Skin is warm and dry.     Findings: No rash.  Neurological:     Mental Status: She is alert and oriented to person, place, and time.     Cranial Nerves: No cranial nerve deficit.     Coordination: Coordination normal.     Deep Tendon Reflexes: Reflexes normal.  Psychiatric:        Behavior: Behavior normal.        Assessment/plan: 1. Hypertension associated with diabetes ([place]) -above goal. Is supposed to be on lisinopril 30mg /day, but has stopped this as well. I will say her home cuff was accurate in office today and her home readings are much lower and nearer to goal. likely some aspect of white coat HTN. I do think she needs to be on medication, but she is very resistant to this. Will continue her home readings and continue to encourage medication to keep this lower as her risks  of cardiovascular are high with her HTN, untreated cholesterol and diabetes. Will continue to work with her on this.  - CBC with Differential/Platelet - Comprehensive metabolic panel - Microalbumin / creatinine urine ratio  2. Type 2 diabetes mellitus with hyperglycemia, without long-term current use of insulin ([facility]) -A1c slightly elevated last visit, but still acceptable with her age -wanting to really work on diet and exercise and has lost 17 pounds and changed diet.  -dicussed I wouldn't start medication at her age unless A1c continues to trend upwards of 7.5.  -declines pneumonia shot and statin (see below) -needs eye exam.  -f/u in 3-6 months depending on labs.   - Hemoglobin A1c - Microalbumin / creatinine urine ratio  3. Hyperlipidemia associated with type 2 diabetes mellitus ([place]) Her LDL was 199 and her ascvd risk is 48%. I have explained to her multiple times she needs statin therapy or some form of lipid lowering therapy, but has declined or not followed through. She does think she had an adverse event to low dose crestor. She never called me to report  this and stopped taking her medication. Will see how her numbers look today although I do not expect a drastic drop and despite this she has diabetes and uncontrolled HTN. Still recommend therapy. Again discussed risks of untreated cholesterol with her co morbidities.  - Lipid panel  4. Vitamin D deficiency  - VITAMIN D 25 Hydroxy (Vit-D Deficiency, Fractures)      Return in about 6 months (around 01/25/2021) for diabetes/htn/chol/D with dr. Jerline Pain .    Orma Flaming, MD [place]   07/25/2020

## 2020-07-28 ENCOUNTER — Ambulatory Visit: Payer: Medicare Other | Attending: Internal Medicine

## 2020-07-28 ENCOUNTER — Ambulatory Visit: Payer: Self-pay

## 2020-07-28 ENCOUNTER — Other Ambulatory Visit (HOSPITAL_BASED_OUTPATIENT_CLINIC_OR_DEPARTMENT_OTHER): Payer: Self-pay

## 2020-07-28 ENCOUNTER — Other Ambulatory Visit: Payer: Self-pay

## 2020-07-28 DIAGNOSIS — Z23 Encounter for immunization: Secondary | ICD-10-CM

## 2020-07-28 MED ORDER — PFIZER-BIONT COVID-19 VAC-TRIS 30 MCG/0.3ML IM SUSP
INTRAMUSCULAR | 0 refills | Status: DC
  Filled 2020-07-28: qty 0.3, 1d supply, fill #0

## 2020-07-28 NOTE — Progress Notes (Signed)
   Covid-19 Vaccination Clinic  Name:  Shelly Yoder    MRN: [id number] DOB: 04/05/1945  07/28/2020  [name] was observed post Covid-19 immunization for 15 minutes without incident. She was provided with Vaccine Information Sheet and instruction to access the V-Safe system.   [name] was instructed to call 911 with any severe reactions post vaccine: Marland Kitchen Difficulty breathing  . Swelling of face and throat  . A fast heartbeat  . A bad rash all over body  . Dizziness and weakness   Immunizations Administered    Name Date Dose VIS Date Route   PFIZER Comrnaty(Gray TOP) Covid-19 Vaccine 07/28/2020 11:37 AM 0.3 mL 02/14/2020 Intramuscular   Manufacturer: [employer]   Lot: [id number]   NDC: [phone]

## 2020-11-26 ENCOUNTER — Other Ambulatory Visit (HOSPITAL_BASED_OUTPATIENT_CLINIC_OR_DEPARTMENT_OTHER): Payer: Self-pay

## 2020-11-26 ENCOUNTER — Ambulatory Visit: Payer: Medicare Other | Attending: Internal Medicine

## 2020-11-26 DIAGNOSIS — Z23 Encounter for immunization: Secondary | ICD-10-CM

## 2020-11-26 MED ORDER — PFIZER COVID-19 VAC BIVALENT 30 MCG/0.3ML IM SUSP
INTRAMUSCULAR | 0 refills | Status: DC
  Filled 2020-11-26: qty 0.3, 1d supply, fill #0

## 2020-11-26 NOTE — Progress Notes (Signed)
   Covid-19 Vaccination Clinic  Name:  Emireth Cockerham    MRN: [id number] DOB: 1946-03-05  11/26/2020  Ms. Mazzarella was observed post Covid-19 immunization for 15 minutes without incident. She was provided with Vaccine Information Sheet and instruction to access the V-Safe system.   Ms. Newill was instructed to call 911 with any severe reactions post vaccine: Difficulty breathing  Swelling of face and throat  A fast heartbeat  A bad rash all over body  Dizziness and weakness

## 2021-01-27 ENCOUNTER — Ambulatory Visit: Payer: Medicare Other | Admitting: Podiatry

## 2021-01-27 ENCOUNTER — Other Ambulatory Visit: Payer: Self-pay

## 2021-01-27 DIAGNOSIS — E1165 Type 2 diabetes mellitus with hyperglycemia: Secondary | ICD-10-CM

## 2021-01-27 DIAGNOSIS — Q828 Other specified congenital malformations of skin: Secondary | ICD-10-CM

## 2021-01-27 NOTE — Patient Instructions (Signed)
Look for urea 40% cream or ointment and apply to the thickened dry skin / calluses. This can be bought over the counter, at a pharmacy or online such as [employer].  

## 2021-01-28 NOTE — Progress Notes (Signed)
  Subjective:  Patient ID: Shelly Yoder, female    DOB: [birth year],  MRN: [id number]  Chief Complaint  Patient presents with   Callouses    Painful callus lesions    75 y.o. female returns for follow-up with the above complaint. History confirmed with patient.  She has type 2 diabetes which remains well controlled.  She has multiple skin lesions that are painful and uncomfortable for her  Objective:  Physical Exam: warm, good capillary refill, no trophic changes or ulcerative lesions, normal DP and PT pulses, normal monofilament exam and normal sensory exam.  Multiple porokeratosis, 4 total on the left foot submetatarsal 2, 3, medial hallux and plantar heel,  and 3 total on the right foot submetatarsal 2 3 and 4 Assessment:   1. Porokeratosis   2. Type 2 diabetes mellitus with hyperglycemia, without long-term current use of insulin ([place])      Plan:  Patient was evaluated and treated and all questions answered.  Patient educated on diabetes. Discussed proper diabetic foot care and discussed risks and complications of disease. Educated patient in depth on reasons to return to the office immediately should he/she discover anything concerning or new on the feet. All questions answered. Discussed proper shoes as well.   All symptomatic hyperkeratoses were safely debrided with a sterile #15 blade to patient's level of comfort without incident. We discussed preventative and palliative care of these lesions including supportive and accommodative shoegear, padding, prefabricated and custom molded accommodative orthoses, use of a pumice stone and lotions/creams daily. I recommended she use urea cream and she got this from our office today  Return if symptoms worsen or fail to improve.

## 2021-02-12 ENCOUNTER — Encounter: Payer: Self-pay | Admitting: Family Medicine

## 2021-02-12 ENCOUNTER — Other Ambulatory Visit: Payer: Self-pay

## 2021-02-12 ENCOUNTER — Ambulatory Visit (INDEPENDENT_AMBULATORY_CARE_PROVIDER_SITE_OTHER): Payer: Medicare Other | Admitting: Family Medicine

## 2021-02-12 VITALS — BP 158/84 | HR 75 | Temp 97.3°F | Ht 69.0 in | Wt 215.8 lb

## 2021-02-12 DIAGNOSIS — E1165 Type 2 diabetes mellitus with hyperglycemia: Secondary | ICD-10-CM

## 2021-02-12 DIAGNOSIS — E785 Hyperlipidemia, unspecified: Secondary | ICD-10-CM | POA: Diagnosis not present

## 2021-02-12 DIAGNOSIS — E1169 Type 2 diabetes mellitus with other specified complication: Secondary | ICD-10-CM | POA: Diagnosis not present

## 2021-02-12 DIAGNOSIS — E2839 Other primary ovarian failure: Secondary | ICD-10-CM

## 2021-02-12 DIAGNOSIS — E559 Vitamin D deficiency, unspecified: Secondary | ICD-10-CM | POA: Diagnosis not present

## 2021-02-12 DIAGNOSIS — D171 Benign lipomatous neoplasm of skin and subcutaneous tissue of trunk: Secondary | ICD-10-CM

## 2021-02-12 DIAGNOSIS — Z0001 Encounter for general adult medical examination with abnormal findings: Secondary | ICD-10-CM

## 2021-02-12 DIAGNOSIS — E669 Obesity, unspecified: Secondary | ICD-10-CM

## 2021-02-12 DIAGNOSIS — Z6831 Body mass index (BMI) 31.0-31.9, adult: Secondary | ICD-10-CM

## 2021-02-12 DIAGNOSIS — I152 Hypertension secondary to endocrine disorders: Secondary | ICD-10-CM

## 2021-02-12 DIAGNOSIS — E1159 Type 2 diabetes mellitus with other circulatory complications: Secondary | ICD-10-CM

## 2021-02-12 LAB — COMPREHENSIVE METABOLIC PANEL
ALT: 12 U/L (ref 0–35)
AST: 19 U/L (ref 0–37)
Albumin: 3.8 g/dL (ref 3.5–5.2)
Alkaline Phosphatase: 60 U/L (ref 39–117)
BUN: 11 mg/dL (ref 6–23)
CO2: 30 mEq/L (ref 19–32)
Calcium: 9.4 mg/dL (ref 8.4–10.5)
Chloride: 104 mEq/L (ref 96–112)
Creatinine, Ser: 0.68 mg/dL (ref 0.40–1.20)
GFR: 85.58 mL/min (ref >60.00)
Glucose, Bld: 98 mg/dL (ref 70–99)
Potassium: 4.1 mEq/L (ref 3.5–5.1)
Sodium: 140 mEq/L (ref 135–145)
Total Bilirubin: 0.4 mg/dL (ref 0.2–1.2)
Total Protein: 6.9 g/dL (ref 6.0–8.3)

## 2021-02-12 LAB — TSH: TSH: 0.87 u[IU]/mL (ref 0.35–5.50)

## 2021-02-12 LAB — CBC
HCT: 36.4 % (ref 36.0–46.0)
Hemoglobin: 12.1 g/dL (ref 12.0–15.0)
MCHC: 33.1 g/dL (ref 30.0–36.0)
MCV: 86.8 fl (ref 78.0–100.0)
Platelets: 174 10*3/uL (ref 150.0–400.0)
RBC: 4.2 Mil/uL (ref 3.87–5.11)
RDW: 13.4 % (ref 11.5–15.5)
WBC: 5 10*3/uL (ref 4.0–10.5)

## 2021-02-12 LAB — LIPID PANEL
Cholesterol: 259 mg/dL — ABNORMAL HIGH (ref 0–200)
HDL: 64.6 mg/dL (ref >39.00)
LDL Cholesterol: 171 mg/dL — ABNORMAL HIGH (ref 0–99)
NonHDL: 194.79
Total CHOL/HDL Ratio: 4
Triglycerides: 118 mg/dL (ref 0.0–149.0)
VLDL: 23.6 mg/dL (ref 0.0–40.0)

## 2021-02-12 LAB — HEMOGLOBIN A1C: Hgb A1c MFr Bld: 6.7 % — ABNORMAL HIGH (ref 4.6–6.5)

## 2021-02-12 LAB — VITAMIN D 25 HYDROXY (VIT D DEFICIENCY, FRACTURES): VITD: 29.53 ng/mL — ABNORMAL LOW (ref 30.00–100.00)

## 2021-02-12 NOTE — Assessment & Plan Note (Signed)
Slightly above goal.  Mornings are at goal per JNC 8.  We discussed lifestyle modifications.  She does not wish to start medications at this point.  We will check labs.  She will let me know if blood pressures are elevated at home.

## 2021-02-12 NOTE — Assessment & Plan Note (Signed)
Check Vitamin D.  

## 2021-02-12 NOTE — Patient Instructions (Signed)
It was very nice to see you today!  Please continue working on diet and exercise.  We will check blood work.  We will order a bone density scan.  They will call you to schedule this.  I will see you back in 1 year for your next physical.  Please come back to see me sooner if needed.  Take care, Dr Jerline Pain  PLEASE NOTE:  If you had any lab tests please let us know if you have not heard back within a few days. You may see your results on mychart before we have a chance to review them but we will give you a call once they are reviewed by [place]. If we ordered any referrals today, please let us know if you have not heard from their office within the next week.   Please try these tips to maintain a healthy lifestyle:  Eat at least 3 REAL meals and 1-2 snacks per day.  Aim for no more than 5 hours between eating.  If you eat breakfast, please do so within one hour of getting up.   Each meal should contain half fruits/vegetables, one quarter protein, and one quarter carbs (no bigger than a computer mouse)  Cut down on sweet beverages. This includes juice, soda, and sweet tea.   Drink at least 1 glass of water with each meal and aim for at least 8 glasses per day  Exercise at least 150 minutes every week.    Preventive Care 94 Years and Older, Female Preventive care refers to lifestyle choices and visits with your health care provider that can promote health and wellness. Preventive care visits are also called wellness exams. What can I expect for my preventive care visit? Counseling Your health care provider may ask you questions about your: Medical history, including: Past medical problems. Family medical history. Pregnancy and menstrual history. History of falls. Current health, including: Memory and ability to understand (cognition). Emotional well-being. Home life and relationship well-being. Sexual activity and sexual health. Lifestyle, including: Alcohol, nicotine or tobacco, and  drug use. Access to firearms. Diet, exercise, and sleep habits. Work and work Statistician. Sunscreen use. Safety issues such as seatbelt and bike helmet use. Physical exam Your health care provider will check your: Height and weight. These may be used to calculate your BMI (body mass index). BMI is a measurement that tells if you are at a healthy weight. Waist circumference. This measures the distance around your waistline. This measurement also tells if you are at a healthy weight and may help predict your risk of certain diseases, such as type 2 diabetes and high blood pressure. Heart rate and blood pressure. Body temperature. Skin for abnormal spots. What immunizations do I need? Vaccines are usually given at various ages, according to a schedule. Your health care provider will recommend vaccines for you based on your age, medical history, and lifestyle or other factors, such as travel or where you work. What tests do I need? Screening Your health care provider may recommend screening tests for certain conditions. This may include: Lipid and cholesterol levels. Hepatitis C test. Hepatitis B test. HIV (human immunodeficiency virus) test. STI (sexually transmitted infection) testing, if you are at risk. Lung cancer screening. Colorectal cancer screening. Diabetes screening. This is done by checking your blood sugar (glucose) after you have not eaten for a while (fasting). Mammogram. Talk with your health care provider about how often you should have regular mammograms. BRCA-related cancer screening. This may be done if you  have a family history of breast, ovarian, tubal, or peritoneal cancers. Bone density scan. This is done to screen for osteoporosis. Talk with your health care provider about your test results, treatment options, and if necessary, the need for more tests. Follow these instructions at home: Eating and drinking  Eat a diet that includes fresh fruits and vegetables,  whole grains, lean protein, and low-fat dairy products. Limit your intake of foods with high amounts of sugar, saturated fats, and salt. Take vitamin and mineral supplements as recommended by your health care provider. Do not drink alcohol if your health care provider tells you not to drink. If you drink alcohol: Limit how much you have to 0-1 drink a day. Know how much alcohol is in your drink. In the [place] one drink equals one 12 oz bottle of beer (355 mL), one 5 oz glass of wine (148 mL), or one 1 oz glass of hard liquor (44 mL). Lifestyle Brush your teeth every morning and night with fluoride toothpaste. Floss one time each day. Exercise for at least 30 minutes 5 or more days each week. Do not use any products that contain nicotine or tobacco. These products include cigarettes, chewing tobacco, and vaping devices, such as e-cigarettes. If you need help quitting, ask your health care provider. Do not use drugs. If you are sexually active, practice safe sex. Use a condom or other form of protection in order to prevent STIs. Take aspirin only as told by your health care provider. Make sure that you understand how much to take and what form to take. Work with your health care provider to find out whether it is safe and beneficial for you to take aspirin daily. Ask your health care provider if you need to take a cholesterol-lowering medicine (statin). Find healthy ways to manage stress, such as: Meditation, yoga, or listening to music. Journaling. Talking to a trusted person. Spending time with friends and family. Minimize exposure to UV radiation to reduce your risk of skin cancer. Safety Always wear your seat belt while driving or riding in a vehicle. Do not drive: If you have been drinking alcohol. Do not ride with someone who has been drinking. When you are tired or distracted. While texting. If you have been using any mind-altering substances or drugs. Wear a helmet and other  protective equipment during sports activities. If you have firearms in your house, make sure you follow all gun safety procedures. What's next? Visit your health care provider once a year for an annual wellness visit. Ask your health care provider how often you should have your eyes and teeth checked. Stay up to date on all vaccines. This information is not intended to replace advice given to you by your health care provider. Make sure you discuss any questions you have with your health care provider. Document Revised: 08/20/2020 Document Reviewed: 08/20/2020 Elsevier Patient Education  Shelly Yoder.

## 2021-02-12 NOTE — Assessment & Plan Note (Signed)
Check labs.  Discussed lifestyle modifications. 

## 2021-02-12 NOTE — Assessment & Plan Note (Signed)
She desires removal.  Will place referral to surgery.

## 2021-02-12 NOTE — Progress Notes (Signed)
Chief Complaint:  Shelly Yoder is a 75 y.o. female who presents today for her annual comprehensive physical exam.    Assessment/Plan:  Chronic Problems Addressed Today: Lipoma of torso She desires removal.  Will place referral to surgery.  Type 2 diabetes mellitus with hyperglycemia, without long-term current use of insulin (HCC) Check A1c.  Not currently on any medications.  We discussed diet and exercise.  Hypertension associated with diabetes ([place]) Slightly above goal.  Mornings are at goal per J[facility] 8.  We discussed lifestyle modifications.  She does not wish to start medications at this point.  We will check labs.  She will let me know if blood pressures are elevated at home.  Hyperlipidemia associated with type 2 diabetes mellitus ([place]) Check labs.  Discussed lifestyle modifications.  Vitamin D deficiency Check Vitamin D  Preventative Healthcare: Discussed shingles. Will get blood work done today.  Due for Bone density. UTD on mammogram and colonoscopy.  Patient Counseling(The following topics were reviewed and/or handout was given):  -Nutrition: Stressed importance of moderation in sodium/caffeine intake, saturated fat and cholesterol, caloric balance, sufficient intake of fresh fruits, vegetables, and fiber.  -Stressed the importance of regular exercise.   -Substance Abuse: Discussed cessation/primary prevention of tobacco, alcohol, or other drug use; driving or other dangerous activities under the influence; availability of treatment for abuse.   -Injury prevention: Discussed safety belts, safety helmets, smoke detector, smoking near bedding or upholstery.   -Sexuality: Discussed sexually transmitted diseases, partner selection, use of condoms, avoidance of unintended pregnancy and contraceptive alternatives.   -Dental health: Discussed importance of regular tooth brushing, flossing, and dental visits.  -Health maintenance and immunizations reviewed. Please refer to  Health maintenance section.  Return to care in 1 year for next preventative visit.     Subjective:  HPI:  She has no acute complaints today.   She has known lipoma on right lower back. She notes it is increasing in size. She would like this to be removed. She is requesting referral for surgeon.  She admits checking her blood sugar at home. Her fasting blood sugar at home was 94 today. Usually in the 130's. She is currently not taking any medication for diabetes mellitus. She is working on diet and exercise.  Her blood pressure at office was elevated today. She reports blood pressure at home usually around 140/65. She stopped taking her blood pressure medication. Denies any chest pain, headaches, shortness of breath, vision changes  Lifestyle Diet: Watch carb's; plenty of fruits and vegetables Exercise: Likes to walk  Depression screen [facility] 2/9 02/12/2021  Decreased Interest 0  Down, Depressed, Hopeless 0  PHQ - 2 Score 0  Altered sleeping -  Tired, decreased energy -  Change in appetite -  Feeling bad or failure about yourself  -  Trouble concentrating -  Moving slowly or fidgety/restless -  Suicidal thoughts -  PHQ-9 Score -  Difficult doing work/chores -    Health Maintenance Due  Topic Date Due   TETANUS/TDAP  Never done   Zoster Vaccines- Shingrix (1 of 2) Never done   DEXA SCAN  Never done   OPHTHALMOLOGY EXAM  01/20/2019   FOOT EXAM  06/20/2020   HEMOGLOBIN A1C  01/25/2021     ROS: Per HPI, otherwise a complete review of systems was negative.   PMH:  The following were reviewed and entered/updated in epic: Past Medical History:  Diagnosis Date   Anemia    Anxiety    situational -  no meds   Arthritis    possibly    Bilateral hearing loss 03/29/2018   no hearing aids   Cataracts, bilateral    forming both eyes - just watching   Colon polyps    Depression    no meds   Diabetes mellitus without complication ([place]) 6222    Diet controlled  no meds    Heart murmur    no problems   Hyperlipidemia    Hyperlipidemia associated with type 2 diabetes mellitus ([place]) 03/29/2018   Hypertension    no meds, working on diet.weight loss   Hypertension associated with diabetes ([place]) 03/29/2018   Lipoma    Lipoma of torso 03/29/2018   Refusal of blood transfusions as patient is Jehovah's Witness 12/04/2014   Type 2 diabetes mellitus with hyperglycemia, without long-term current use of insulin ([place]) 03/29/2018   Vitamin D deficiency 03/29/2018   Patient Active Problem List   Diagnosis Date Noted   Vitamin D deficiency 03/29/2018   Hyperlipidemia associated with type 2 diabetes mellitus ([place]) 03/29/2018   Hypertension associated with diabetes ([place]) 03/29/2018   Type 2 diabetes mellitus with hyperglycemia, without long-term current use of insulin ([place]) 03/29/2018   Family history of breast cancer in first degree relative, sister 03/29/2018   Lipoma of torso 03/29/2018   Morbid obesity ([place]) 03/29/2018   ASCVD (arteriosclerotic cardiovascular disease) high risk 03/29/2018   Refusal of blood transfusions as patient is Jehovah's Witness 12/04/2014   Past Surgical History:  Procedure Laterality Date   ABDOMINAL HYSTERECTOMY  1996   partial    BREAST BIOPSY Right    COLONOSCOPY  06/12/2015   LIPOMA EXCISION     upper back   POLYPECTOMY      Family History  Problem Relation Age of Onset   Diabetes Mother    Heart attack Mother    Hyperlipidemia Mother    Heart disease Mother 76   Heart block Mother    Kidney disease Mother    Alcohol abuse Father    Diabetes Father    Heart disease Father    Kidney disease Father    Breast cancer Sister    Diabetes Brother    Hypertension Brother    Kidney disease Brother    Colon cancer Neg Hx    Esophageal cancer Neg Hx    Stomach cancer Neg Hx    Rectal cancer Neg Hx    Colon polyps Neg Hx     Medications- reviewed and updated Current Outpatient Medications  Medication Sig Dispense Refill    Cyanocobalamin (B-12 PO) Take by mouth.     glucose blood (ONETOUCH VERIO) test strip USE TO CHECK BLOOD SUGAR DAILY AND PRN 100 each 4   Multiple Vitamin (MULTI-VITAMINS) TABS Take by mouth.     OneTouch Delica Lancets 97L MISC USE TO CHECK BLOOD SUGAR DAILY AND PRN 100 each 4   Vitamin D, Ergocalciferol, (DRISDOL) 1.25 MG (50000 UNIT) CAPS capsule TAKE ONE CAPSULE BY MOUTH ONCE WEEKLY FOR 8 WEEKS THEN TAKE 1000 IU/DAY 8 capsule 0   No current facility-administered medications for this visit.    Allergies-reviewed and updated Allergies  Allergen Reactions   Atorvastatin Other (See Comments)    Muscle aches   Hydrocodone-Acetaminophen Other (See Comments)    Social History   Socioeconomic History   Marital status: Married    Spouse name: Not on file   Number of children: 3   Years of education: Not on file   Highest education level:  Not on file  Occupational History   Occupation: Retired    Comment: Publishing rights manager   Tobacco Use   Smoking status: Former    Types: Cigarettes    Quit date: 06/15/1975    Years since quitting: 45.6   Smokeless tobacco: Never  Vaping Use   Vaping Use: Never used  Substance and Sexual Activity   Alcohol use: Yes    Alcohol/week: 0.0 standard drinks    Comment: occasionally Wine/Beer   Drug use: No   Sexual activity: Not Currently    Birth control/protection: Surgical    Comment: Hysterectomy  Other Topics Concern   Not on file  Social History Narrative   Not on file   Social Determinants of Health   Financial Resource Strain: Low Risk    Difficulty of Paying Living Expenses: Not hard at all  Food Insecurity: No Food Insecurity   Worried About Charity fundraiser in the Last Year: Never true   [place] in the Last Year: Never true  Transportation Needs: No Transportation Needs   Lack of Transportation (Medical): No   Lack of Transportation (Non-Medical): No  Physical Activity: Inactive   Days of Exercise per Week: 0 days    Minutes of Exercise per Session: 0 min  Stress: No Stress Concern Present   Feeling of Stress : Not at all  Social Connections: Moderately Integrated   Frequency of Communication with Friends and Family: More than three times a week   Frequency of Social Gatherings with Friends and Family: Once a week   Attends Religious Services: More than 4 times per year   Active Member of [employer] or Organizations: No   Attends Music therapist: Never   Marital Status: Married        Objective:  Physical Exam: BP (!) 158/84   Pulse 75   Temp (!) 97.3 F (36.3 C) (Temporal)   Ht 5\' 9"  (1.753 m)   Wt 215 lb 12.8 oz (97.9 kg)   LMP  (LMP Unknown)   SpO2 100%   BMI 31.87 kg/m   Body mass index is 31.87 kg/m. Wt Readings from Last 3 Encounters:  02/12/21 215 lb 12.8 oz (97.9 kg)  07/25/20 220 lb 6.4 oz (100 kg)  02/15/20 229 lb 3.2 oz (104 kg)   Gen: NAD, resting comfortably HEENT: TMs normal bilaterally. OP clear. No thyromegaly noted.  CV: RRR with no murmurs appreciated Pulm: NWOB, CTAB with no crackles, wheezes, or rhonchi GI: Normal bowel sounds present. Soft, Nontender, Nondistended. MSK: no edema, cyanosis, or clubbing noted Skin: warm, dry.  Approximately 10 cm freely mobile mass on right flank. Neuro: CN2-12 grossly intact. Strength 5/5 in upper and lower extremities. Reflexes symmetric and intact bilaterally.  Psych: Normal affect and thought content      I,Savera Zaman,acting as a scribe for Dimas Chyle, MD.,have documented all relevant documentation on the behalf of Dimas Chyle, MD,as directed by  Dimas Chyle, MD while in the presence of Dimas Chyle, MD.   I, Dimas Chyle, MD, have reviewed all documentation for this visit. The documentation on 02/12/21 for the exam, diagnosis, procedures, and orders are all accurate and complete.  Algis Greenhouse. Jerline Pain, MD 02/12/2021 11:19 AM

## 2021-02-12 NOTE — Assessment & Plan Note (Signed)
Check A1c.  Not currently on any medications.  We discussed diet and exercise.

## 2021-02-13 NOTE — Progress Notes (Signed)
Please inform patient of the following:  Vitamin D is a little low but stable. She should continue 1000-2000 IU daily and we can recheck in 6-12 months.   Cholesterol and blood sugar are both elevated but stable and better than last time. Everything else is NORMAL.  Do not need to make any changes to her treatment plan at this time. We can recheck in a year or so.  Shelly Yoder. Jerline Pain, MD 02/13/2021 8:16 AM

## 2021-02-16 ENCOUNTER — Ambulatory Visit (INDEPENDENT_AMBULATORY_CARE_PROVIDER_SITE_OTHER): Payer: Medicare Other

## 2021-02-16 ENCOUNTER — Other Ambulatory Visit: Payer: Self-pay

## 2021-02-16 DIAGNOSIS — Z0000 Encounter for general adult medical examination without abnormal findings: Secondary | ICD-10-CM | POA: Diagnosis not present

## 2021-02-16 NOTE — Patient Instructions (Signed)
Shelly Yoder , Thank you for taking time to come for your Medicare Wellness Visit. I appreciate your ongoing commitment to your health goals. Please review the following plan we discussed and let me know if I can assist you in the future.   Screening recommendations/referrals: Colonoscopy: Done 07/16/19 repeat every 2 years  Mammogram: Done 06/03/20 repeat every year Bone Density: Ordered 02/12/21  Recommended yearly ophthalmology/optometry visit for glaucoma screening and checkup Recommended yearly dental visit for hygiene and checkup  Vaccinations: Influenza vaccine: Declined and discussed  Pneumococcal vaccine: Declined and discussed Tdap vaccine: Due and discussed  Shingles vaccine: Shingrix discussed. Please contact your pharmacy for coverage information.    Covid-19:Completed 2/4, 3/1, 11/13/19, & 5/23, & 11/26/20  Advanced directives: Please bring a copy of your health care power of attorney and living will to the office at your convenience.  Conditions/risks identified: Get back to exercise and maintain healthy diet   Next appointment: Follow up in one year for your annual wellness visit    Preventive Care 65 Years and Older, Female Preventive care refers to lifestyle choices and visits with your health care provider that can promote health and wellness. What does preventive care include? A yearly physical exam. This is also called an annual well check. Dental exams once or twice a year. Routine eye exams. Ask your health care provider how often you should have your eyes checked. Personal lifestyle choices, including: Daily care of your teeth and gums. Regular physical activity. Eating a healthy diet. Avoiding tobacco and drug use. Limiting alcohol use. Practicing safe sex. Taking low-dose aspirin every day. Taking vitamin and mineral supplements as recommended by your health care provider. What happens during an annual well check? The services and screenings done by your  health care provider during your annual well check will depend on your age, overall health, lifestyle risk factors, and family history of disease. Counseling  Your health care provider may ask you questions about your: Alcohol use. Tobacco use. Drug use. Emotional well-being. Home and relationship well-being. Sexual activity. Eating habits. History of falls. Memory and ability to understand (cognition). Work and work Statistician. Reproductive health. Screening  You may have the following tests or measurements: Height, weight, and BMI. Blood pressure. Lipid and cholesterol levels. These may be checked every 5 years, or more frequently if you are over 86 years old. Skin check. Lung cancer screening. You may have this screening every year starting at age 74 if you have a 30-pack-year history of smoking and currently smoke or have quit within the past 15 years. Fecal occult blood test (FOBT) of the stool. You may have this test every year starting at age 74. Flexible sigmoidoscopy or colonoscopy. You may have a sigmoidoscopy every 5 years or a colonoscopy every 10 years starting at age 74. Hepatitis C blood test. Hepatitis B blood test. Sexually transmitted disease (STD) testing. Diabetes screening. This is done by checking your blood sugar (glucose) after you have not eaten for a while (fasting). You may have this done every 1-3 years. Bone density scan. This is done to screen for osteoporosis. You may have this done starting at age 74. Mammogram. This may be done every 1-2 years. Talk to your health care provider about how often you should have regular mammograms. Talk with your health care provider about your test results, treatment options, and if necessary, the need for more tests. Vaccines  Your health care provider may recommend certain vaccines, such as: Influenza vaccine. This is recommended  every year. Tetanus, diphtheria, and acellular pertussis (Tdap, Td) vaccine. You may  need a Td booster every 10 years. Zoster vaccine. You may need this after age 12. Pneumococcal 13-valent conjugate (PCV13) vaccine. One dose is recommended after age 74. Pneumococcal polysaccharide (PPSV23) vaccine. One dose is recommended after age 74. Talk to your health care provider about which screenings and vaccines you need and how often you need them. This information is not intended to replace advice given to you by your health care provider. Make sure you discuss any questions you have with your health care provider. Document Released: 03/21/2015 Document Revised: 11/12/2015 Document Reviewed: 12/24/2014 Elsevier Interactive Patient Education  2017 [place] Prevention in the Home Falls can cause injuries. They can happen to people of all ages. There are many things you can do to make your home safe and to help prevent falls. What can I do on the outside of my home? Regularly fix the edges of walkways and driveways and fix any cracks. Remove anything that might make you trip as you walk through a door, such as a raised step or threshold. Trim any bushes or trees on the path to your home. Use bright outdoor lighting. Clear any walking paths of anything that might make someone trip, such as rocks or tools. Regularly check to see if handrails are loose or broken. Make sure that both sides of any steps have handrails. Any raised decks and porches should have guardrails on the edges. Have any leaves, snow, or ice cleared regularly. Use sand or salt on walking paths during winter. Clean up any spills in your garage right away. This includes oil or grease spills. What can I do in the bathroom? Use night lights. Install grab bars by the toilet and in the tub and shower. Do not use towel bars as grab bars. Use non-skid mats or decals in the tub or shower. If you need to sit down in the shower, use a plastic, non-slip stool. Keep the floor dry. Clean up any water that spills on  the floor as soon as it happens. Remove soap buildup in the tub or shower regularly. Attach bath mats securely with double-sided non-slip rug tape. Do not have throw rugs and other things on the floor that can make you trip. What can I do in the bedroom? Use night lights. Make sure that you have a light by your bed that is easy to reach. Do not use any sheets or blankets that are too big for your bed. They should not hang down onto the floor. Have a firm chair that has side arms. You can use this for support while you get dressed. Do not have throw rugs and other things on the floor that can make you trip. What can I do in the kitchen? Clean up any spills right away. Avoid walking on wet floors. Keep items that you use a lot in easy-to-reach places. If you need to reach something above you, use a strong step stool that has a grab bar. Keep electrical cords out of the way. Do not use floor polish or wax that makes floors slippery. If you must use wax, use non-skid floor wax. Do not have throw rugs and other things on the floor that can make you trip. What can I do with my stairs? Do not leave any items on the stairs. Make sure that there are handrails on both sides of the stairs and use them. Fix handrails that are broken  or loose. Make sure that handrails are as long as the stairways. Check any carpeting to make sure that it is firmly attached to the stairs. Fix any carpet that is loose or worn. Avoid having throw rugs at the top or bottom of the stairs. If you do have throw rugs, attach them to the floor with carpet tape. Make sure that you have a light switch at the top of the stairs and the bottom of the stairs. If you do not have them, ask someone to add them for you. What else can I do to help prevent falls? Wear shoes that: Do not have high heels. Have rubber bottoms. Are comfortable and fit you well. Are closed at the toe. Do not wear sandals. If you use a stepladder: Make sure  that it is fully opened. Do not climb a closed stepladder. Make sure that both sides of the stepladder are locked into place. Ask someone to hold it for you, if possible. Clearly mark and make sure that you can see: Any grab bars or handrails. First and last steps. Where the edge of each step is. Use tools that help you move around (mobility aids) if they are needed. These include: Canes. Walkers. Scooters. Crutches. Turn on the lights when you go into a dark area. Replace any light bulbs as soon as they burn out. Set up your furniture so you have a clear path. Avoid moving your furniture around. If any of your floors are uneven, fix them. If there are any pets around you, be aware of where they are. Review your medicines with your doctor. Some medicines can make you feel dizzy. This can increase your chance of falling. Ask your doctor what other things that you can do to help prevent falls. This information is not intended to replace advice given to you by your health care provider. Make sure you discuss any questions you have with your health care provider. Document Released: 12/19/2008 Document Revised: 07/31/2015 Document Reviewed: 03/29/2014 Elsevier Interactive Patient Education  2017 [employer].

## 2021-02-16 NOTE — Progress Notes (Signed)
Virtual Visit via Telephone Note  I connected with  Shelly Yoder on 02/16/21 at  8:00 AM EST by telephone and verified that I am speaking with the correct person using two identifiers.  Medicare Annual Wellness visit completed telephonically due to Covid-19 pandemic.   Persons participating in this call: This Health Coach and this patient.   Location: Patient: Home Provider: Office   I discussed the limitations, risks, security and privacy concerns of performing an evaluation and management service by telephone and the availability of in person appointments. The patient expressed understanding and agreed to proceed.  Unable to perform video visit due to video visit attempted and failed and/or patient does not have video capability.   Some vital signs may be absent or patient reported.   Willette Brace, LPN   Subjective:   Shelly Yoder is a 75 y.o. female who presents for Medicare Annual (Subsequent) preventive examination.  Review of Systems     Cardiac Risk Factors include: advanced age (>66men, >16 women);diabetes mellitus;hypertension;dyslipidemia;obesity (BMI >30kg/m2)     Objective:    There were no vitals filed for this visit. There is no height or weight on file to calculate BMI.  Advanced Directives 02/16/2021 02/15/2020 01/18/2019 06/12/2015  Does Patient Have a Medical Advance Directive? Yes Yes Yes Yes  Type of Paramedic of [place];Living will Living will;Healthcare Power of [place]  Does patient want to make changes to medical advance directive? - - No - Patient declined -  Copy of [place] in Chart? No - copy requested No - copy requested No - copy requested -    Current Medications (verified) Outpatient Encounter Medications as of 02/16/2021  Medication Sig   Cyanocobalamin (B-12 PO) Take by mouth.   glucose blood (ONETOUCH VERIO) test strip  USE TO CHECK BLOOD SUGAR DAILY AND PRN   Multiple Vitamin (MULTI-VITAMINS) TABS Take by mouth.   OneTouch Delica Lancets 36R MISC USE TO CHECK BLOOD SUGAR DAILY AND PRN   Vitamin D, Ergocalciferol, (DRISDOL) 1.25 MG (50000 UNIT) CAPS capsule TAKE ONE CAPSULE BY MOUTH ONCE WEEKLY FOR 8 WEEKS THEN TAKE 1000 IU/DAY   No facility-administered encounter medications on file as of 02/16/2021.    Allergies (verified) Atorvastatin and Hydrocodone-acetaminophen   History: Past Medical History:  Diagnosis Date   Anemia    Anxiety    situational - no meds   Arthritis    possibly    Bilateral hearing loss 03/29/2018   no hearing aids   Cataracts, bilateral    forming both eyes - just watching   Colon polyps    Depression    no meds   Diabetes mellitus without complication ([place]) 4431    Diet controlled  no meds   Heart murmur    no problems   Hyperlipidemia    Hyperlipidemia associated with type 2 diabetes mellitus ([place]) 03/29/2018   Hypertension    no meds, working on diet.weight loss   Hypertension associated with diabetes ([place]) 03/29/2018   Lipoma    Lipoma of torso 03/29/2018   Refusal of blood transfusions as patient is Jehovah's Witness 12/04/2014   Type 2 diabetes mellitus with hyperglycemia, without long-term current use of insulin ([place]) 03/29/2018   Vitamin D deficiency 03/29/2018   Past Surgical History:  Procedure Laterality Date   ABDOMINAL HYSTERECTOMY  1996   partial    BREAST BIOPSY Right    COLONOSCOPY  06/12/2015  LIPOMA EXCISION     upper back   POLYPECTOMY     Family History  Problem Relation Age of Onset   Diabetes Mother    Heart attack Mother    Hyperlipidemia Mother    Heart disease Mother 41   Heart block Mother    Kidney disease Mother    Alcohol abuse Father    Diabetes Father    Heart disease Father    Kidney disease Father    Breast cancer Sister    Diabetes Brother    Hypertension Brother    Kidney disease Brother    Colon cancer Neg Hx     Esophageal cancer Neg Hx    Stomach cancer Neg Hx    Rectal cancer Neg Hx    Colon polyps Neg Hx    Social History   Socioeconomic History   Marital status: Married    Spouse name: Not on file   Number of children: 3   Years of education: Not on file   Highest education level: Not on file  Occupational History   Occupation: Retired    Comment: Publishing rights manager   Tobacco Use   Smoking status: Former    Types: Cigarettes    Quit date: 06/15/1975    Years since quitting: 45.7   Smokeless tobacco: Never  Vaping Use   Vaping Use: Never used  Substance and Sexual Activity   Alcohol use: Yes    Alcohol/week: 0.0 standard drinks    Comment: occasionally Wine/Beer   Drug use: No   Sexual activity: Not Currently    Birth control/protection: Surgical    Comment: Hysterectomy  Other Topics Concern   Not on file  Social History Narrative   Not on file   Social Determinants of Health   Financial Resource Strain: Low Risk    Difficulty of Paying Living Expenses: Not hard at all  Food Insecurity: No Food Insecurity   Worried About Charity fundraiser in the Last Year: Never true   [place] in the Last Year: Never true  Transportation Needs: No Transportation Needs   Lack of Transportation (Medical): No   Lack of Transportation (Non-Medical): No  Physical Activity: Inactive   Days of Exercise per Week: 0 days   Minutes of Exercise per Session: 0 min  Stress: No Stress Concern Present   Feeling of Stress : Only a little  Social Connections: Moderately Integrated   Frequency of Communication with Friends and Family: More than three times a week   Frequency of Social Gatherings with Friends and Family: More than three times a week   Attends Religious Services: More than 4 times per year   Active Member of [employer] or Organizations: No   Attends Music therapist: Never   Marital Status: Married    Tobacco Counseling Counseling given: Not  Answered   Clinical Intake:  Pre-visit preparation completed: Yes  Pain : No/denies pain     BMI - recorded: 31.87 Nutritional Status: BMI > 30  Obese Nutritional Risks: None Diabetes: Yes CBG done?: Yes (97) CBG resulted in Enter/ Edit results?: No Did pt. bring in CBG monitor from home?: No  How often do you need to have someone help you when you read instructions, pamphlets, or other written materials from your doctor or pharmacy?: 1 - Never  Diabetic?Nutrition Risk Assessment:  Has the patient had any N/V/D within the last 2 months?  No  Does the patient have any non-healing wounds?  No  Has the patient had any unintentional weight loss or weight gain?  No   Diabetes:  Is the patient diabetic?  Yes  If diabetic, was a CBG obtained today?  Yes  Did the patient bring in their glucometer from home?  No  How often do you monitor your CBG's? Daily.   Financial Strains and Diabetes Management:  Are you having any financial strains with the device, your supplies or your medication? No .  Does the patient want to be seen by Chronic Care Management for management of their diabetes?  No  Would the patient like to be referred to a Nutritionist or for Diabetic Management?  No   Diabetic Exams:  Diabetic Eye Exam: Completed 03/10/20 Diabetic Foot Exam: Completed 12/08/20   Interpreter Needed?: No  Information entered by :: Charlott Rakes, LPN   Activities of Daily Living In your present state of health, do you have any difficulty performing the following activities: 02/16/2021  Hearing? Y  Comment slight loss/ tinnitus  Vision? N  Difficulty concentrating or making decisions? N  Walking or climbing stairs? N  Dressing or bathing? N  Doing errands, shopping? N  Preparing Food and eating ? N  Using the Toilet? N  In the past six months, have you accidently leaked urine? N  Do you have problems with loss of bowel control? N  Managing your Medications? N  Managing  your Finances? N  Housekeeping or managing your Housekeeping? N  Some recent data might be hidden    Patient Care Team: Vivi Barrack, MD as PCP - General (Family Medicine) Ladene Artist, MD as Consulting Physician (Gastroenterology) Leta Baptist, MD as Consulting Physician (Otolaryngology) [place], P.A. as Consulting Physician Allyn Kenner, MD as Consulting Physician (Dermatology)  Indicate any recent Medical Services you may have received from other than [facility] providers in the past year (date may be approximate).     Assessment:   This is a routine wellness examination for [facility].  Hearing/Vision screen Hearing Screening - Comments:: Pt has slight hearing loss/ tinnitus  Vision Screening - Comments:: Pt follows up with Dr Katy Fitch for annual eye exams   Dietary issues and exercise activities discussed: Current Exercise Habits: The patient does not participate in regular exercise at present   Goals Addressed             This Visit's Progress    Patient Stated       More exercise and maintain diet        Depression Screen PHQ 2/9 Scores 02/16/2021 02/12/2021 07/25/2020 02/15/2020 02/07/2020 07/19/2019 02/16/2019  PHQ - 2 Score 0 0 0 0 0 0 0  PHQ- 9 Score - - - - - - -    Fall Risk Fall Risk  02/16/2021 02/12/2021 02/15/2020 02/07/2020 11/08/2019  Falls in the past year? 0 0 0 0 0  Number falls in past yr: 0 0 0 - -  Injury with Fall? 0 0 0 - -  Risk for fall due to : Impaired vision No Fall Risks Impaired vision;Impaired mobility No Fall Risks No Fall Risks  Follow up Falls prevention discussed - Falls prevention discussed - -    FALL RISK PREVENTION PERTAINING TO THE HOME:  Any stairs in or around the home? No  If so, are there any without handrails? No  Home free of loose throw rugs in walkways, pet beds, electrical cords, etc? Yes  Adequate lighting in your home to reduce risk of falls?  Yes   ASSISTIVE DEVICES UTILIZED TO PREVENT FALLS:  Life alert?  No  Use of a cane, walker or w/c? No  Grab bars in the bathroom? No  Shower chair or bench in shower? No  Elevated toilet seat or a handicapped toilet? No   TIMED UP AND GO:  Was the test performed? No .  Cognitive Function:     6CIT Screen 02/16/2021 02/15/2020  What Year? 0 points 0 points  What month? 0 points 0 points  What time? 0 points -  Count back from 20 0 points 0 points  Months in reverse 0 points 0 points  Repeat phrase 0 points 0 points  Total Score 0 -    Immunizations Immunization History  Administered Date(s) Administered   PFIZER Comirnaty(Gray Top)Covid-19 Tri-Sucrose Vaccine 07/28/2020   PFIZER(Purple Top)SARS-COV-2 Vaccination 04/12/2019, 05/07/2019, 11/13/2019   Pfizer Covid-19 Vaccine Bivalent Booster 59yrs & up 11/26/2020    TDAP status: Due, Education has been provided regarding the importance of this vaccine. Advised may receive this vaccine at local pharmacy or Health Dept. Aware to provide a copy of the vaccination record if obtained from local pharmacy or Health Dept. Verbalized acceptance and understanding.  Flu Vaccine status: Declined, Education has been provided regarding the importance of this vaccine but patient still declined. Advised may receive this vaccine at local pharmacy or Health Dept. Aware to provide a copy of the vaccination record if obtained from local pharmacy or Health Dept. Verbalized acceptance and understanding.  Pneumococcal vaccine status: Declined,  Education has been provided regarding the importance of this vaccine but patient still declined. Advised may receive this vaccine at local pharmacy or Health Dept. Aware to provide a copy of the vaccination record if obtained from local pharmacy or Health Dept. Verbalized acceptance and understanding.   Covid-19 vaccine status: Completed vaccines  Qualifies for Shingles Vaccine? Yes   Zostavax completed No   Shingrix Completed?: No.    Education has been provided regarding the  importance of this vaccine. Patient has been advised to call insurance company to determine out of pocket expense if they have not yet received this vaccine. Advised may also receive vaccine at local pharmacy or Health Dept. Verbalized acceptance and understanding.  Screening Tests Health Maintenance  Topic Date Due   TETANUS/TDAP  Never done   Zoster Vaccines- Shingrix (1 of 2) Never done   DEXA SCAN  Never done   INFLUENZA VACCINE  06/05/2021 (Originally 10/06/2020)   Pneumonia Vaccine 18+ Years old (1 - PCV) 02/12/2022 (Originally 05/02/1951)   OPHTHALMOLOGY EXAM  03/10/2021   COLONOSCOPY (Pts 45-52yrs Insurance coverage will need to be confirmed)  07/15/2021   URINE MICROALBUMIN  07/25/2021   HEMOGLOBIN A1C  08/13/2021   FOOT EXAM  12/08/2021   MAMMOGRAM  06/04/2022   COVID-19 Vaccine  Completed   Hepatitis C Screening  Completed   HPV VACCINES  Aged Out    Health Maintenance  Health Maintenance Due  Topic Date Due   TETANUS/TDAP  Never done   Zoster Vaccines- Shingrix (1 of 2) Never done   DEXA SCAN  Never done    Colorectal cancer screening: Type of screening: Colonoscopy. Completed 07/16/19. Repeat every 2 years  Mammogram status: Completed 06/03/20. Repeat every year  Bone Density status: Ordered 02/12/21. Pt provided with contact info and advised to call to schedule appt.  Additional Screening:  Hepatitis C Screening:   Completed 01/23/16  Vision Screening: Recommended annual ophthalmology exams for early detection of glaucoma and  other disorders of the eye. Is the patient up to date with their annual eye exam?  Yes  Who is the provider or what is the name of the office in which the patient attends annual eye exams? Dr Katy Fitch If pt is not established with a provider, would they like to be referred to a provider to establish care? No .   Dental Screening: Recommended annual dental exams for proper oral hygiene  Community Resource Referral / Chronic Care  Management: CRR required this visit?  No   CCM required this visit?  No      Plan:     I have personally reviewed and noted the following in the patient's chart:   Medical and social history Use of alcohol, tobacco or illicit drugs  Current medications and supplements including opioid prescriptions.  Functional ability and status Nutritional status Physical activity Advanced directives List of other physicians Hospitalizations, surgeries, and ER visits in previous 12 months Vitals Screenings to include cognitive, depression, and falls Referrals and appointments  In addition, I have reviewed and discussed with patient certain preventive protocols, quality metrics, and best practice recommendations. A written personalized care plan for preventive services as well as general preventive health recommendations were provided to patient.     Willette Brace, LPN   [id number]   Nurse Notes: None

## 2021-03-16 ENCOUNTER — Telehealth (INDEPENDENT_AMBULATORY_CARE_PROVIDER_SITE_OTHER): Payer: Medicare Other | Admitting: Nurse Practitioner

## 2021-03-16 ENCOUNTER — Encounter: Payer: Self-pay | Admitting: Nurse Practitioner

## 2021-03-16 VITALS — Temp 98.3°F

## 2021-03-16 DIAGNOSIS — U071 COVID-19: Secondary | ICD-10-CM | POA: Insufficient documentation

## 2021-03-16 MED ORDER — NIRMATRELVIR/RITONAVIR (PAXLOVID)TABLET: 3.0000 | ORAL_TABLET | Freq: Two times a day (BID) | ORAL | 0 refills | Status: AC

## 2021-03-16 NOTE — Assessment & Plan Note (Signed)
Patient positive for COVID-19.  Did discuss antiviral synovator EUA only.  After discussion decided to pursue antiviral treatment.  Discussed common side effects of chosen medication.  Discussed CDC guidelines in regards to quarantine guidelines.  Also discussed signs and symptoms when to be seen urgent or emergent. Start paxlovid as soon as possible

## 2021-03-16 NOTE — Progress Notes (Signed)
Patient ID: Shelly Yoder, female    DOB: March 21, 1945, 76 y.o.   MRN: [id number]  Virtual visit completed through [place], a video enabled telemedicine application. Due to national recommendations of social distancing due to COVID-19, a virtual visit is felt to be most appropriate for this patient at this time. Reviewed limitations, risks, security and privacy concerns of performing a virtual visit and the availability of in person appointments. I also reviewed that there may be a patient responsible charge related to this service. The patient agreed to proceed.   Attempted to connect via video enabled program. After being unsuccessful we reverted to a telephone encounter.  Phone call lasted 44mins and 10 seconds  Patient location: home Provider location: [place] at [facility], office Persons participating in this virtual visit: patient, provider   If any vitals were documented, they were collected by patient at home unless specified below.    Temp 98.3 F (36.8 C) Comment: per patient   LMP  (LMP Unknown)    CC: Covid 19 Subjective:   HPI: Shelly Yoder is a 76 y.o. female presenting on 03/16/2021 for Covid Positive (On 03/13/21, sx started on 03/13/21- had fever but that's better, achy, sinus congestion, fatigue, some cough)  Symptoms on Friday 03/13/2021 Covid test at home on 03/13/2021 and was positive  Pfizer x2 and 3 boosters Husband  Alj Nature sunshine OTC has helped   Relevant past medical, surgical, family and social history reviewed and updated as indicated. Interim medical history since our last visit reviewed. Allergies and medications reviewed and updated. Outpatient Medications Prior to Visit  Medication Sig Dispense Refill   glucose blood (ONETOUCH VERIO) test strip USE TO CHECK BLOOD SUGAR DAILY AND PRN 100 each 4   Multiple Vitamin (MULTI-VITAMINS) TABS Take by mouth.     OneTouch Delica Lancets 36O MISC USE TO CHECK BLOOD SUGAR DAILY AND PRN 100 each 4    VITAMIN D, CHOLECALCIFEROL, PO Take by mouth. daily     Cyanocobalamin (B-12 PO) Take by mouth.     Vitamin D, Ergocalciferol, (DRISDOL) 1.25 MG (50000 UNIT) CAPS capsule TAKE ONE CAPSULE BY MOUTH ONCE WEEKLY FOR 8 WEEKS THEN TAKE 1000 IU/DAY 8 capsule 0   No facility-administered medications prior to visit.     Per HPI unless specifically indicated in ROS section below Review of Systems  Constitutional:  Positive for appetite change, fatigue and fever (improved). Negative for chills.  HENT:  Positive for congestion. Negative for ear discharge, ear pain, rhinorrhea, sinus pressure, sinus pain and sore throat.   Respiratory:  Negative for cough and shortness of breath.   Cardiovascular:  Negative for chest pain.  Gastrointestinal:  Negative for abdominal pain, diarrhea, nausea and vomiting.  Musculoskeletal:  Negative for arthralgias and myalgias.  Neurological:  Positive for light-headedness. Negative for dizziness and headaches.  Objective:  Temp 98.3 F (36.8 C) Comment: per patient   LMP  (LMP Unknown)   Wt Readings from Last 3 Encounters:  02/12/21 215 lb 12.8 oz (97.9 kg)  07/25/20 220 lb 6.4 oz (100 kg)  02/15/20 229 lb 3.2 oz (104 kg)       Physical exam: Gen: alert, NAD, not ill appearing Pulm: speaks in complete sentences without increased work of breathing Psych: normal mood, normal thought content      Results for orders placed or performed in visit on 02/12/21  CBC  Result Value Ref Range   WBC 5.0 4.0 - 10.5 K/uL   RBC  4.20 3.87 - 5.11 Mil/uL   Platelets 174.0 150.0 - 400.0 K/uL   Hemoglobin 12.1 12.0 - 15.0 g/dL   HCT 36.4 36.0 - 46.0 %   MCV 86.8 78.0 - 100.0 fl   MCHC 33.1 30.0 - 36.0 g/dL   RDW 13.4 11.5 - 15.5 %  Comprehensive metabolic panel  Result Value Ref Range   Sodium 140 135 - 145 mEq/L   Potassium 4.1 3.5 - 5.1 mEq/L   Chloride 104 96 - 112 mEq/L   CO2 30 19 - 32 mEq/L   Glucose, Bld 98 70 - 99 mg/dL   BUN 11 6 - 23 mg/dL   Creatinine,  Ser 0.68 0.40 - 1.20 mg/dL   Total Bilirubin 0.4 0.2 - 1.2 mg/dL   Alkaline Phosphatase 60 39 - 117 U/L   AST 19 0 - 37 U/L   ALT 12 0 - 35 U/L   Total Protein 6.9 6.0 - 8.3 g/dL   Albumin 3.8 3.5 - 5.2 g/dL   GFR 85.58 >60.00 mL/min   Calcium 9.4 8.4 - 10.5 mg/dL  Hemoglobin A1c  Result Value Ref Range   Hgb A1c MFr Bld 6.7 (H) 4.6 - 6.5 %  TSH  Result Value Ref Range   TSH 0.87 0.35 - 5.50 uIU/mL  Lipid panel  Result Value Ref Range   Cholesterol 259 (H) 0 - 200 mg/dL   Triglycerides 118.0 0.0 - 149.0 mg/dL   HDL 64.60 >39.00 mg/dL   VLDL 23.6 0.0 - 40.0 mg/dL   LDL Cholesterol 171 (H) 0 - 99 mg/dL   Total CHOL/HDL Ratio 4    NonHDL 194.79   VITAMIN D 25 Hydroxy (Vit-D Deficiency, Fractures)  Result Value Ref Range   VITD 29.53 (L) 30.00 - 100.00 ng/mL   Assessment & Plan:   Problem List Items Addressed This Visit   None Visit Diagnoses     COVID-19    -  Primary   Relevant Medications   nirmatrelvir/ritonavir EUA (PAXLOVID) 20 x 150 MG & 10 x 100MG  TABS        Meds ordered this encounter  Medications   nirmatrelvir/ritonavir EUA (PAXLOVID) 20 x 150 MG & 10 x 100MG  TABS    Sig: Take 3 tablets by mouth 2 (two) times daily for 5 days. (Take nirmatrelvir 150 mg two tablets twice daily for 5 days and ritonavir 100 mg one tablet twice daily for 5 days) Patient GFR is 85.58    Dispense:  30 tablet    Refill:  0    Order Specific Question:   Supervising Provider    Answer:   TOWER, MARNE A [[id number]]   No orders of the defined types were placed in this encounter.   I discussed the assessment and treatment plan with the patient. The patient was provided an opportunity to ask questions and all were answered. The patient agreed with the plan and demonstrated an understanding of the instructions. The patient was advised to call back or seek an in-person evaluation if the symptoms worsen or if the condition fails to improve as anticipated.  Follow up plan: No follow-ups on  file.  Romilda Garret, NP

## 2021-04-14 ENCOUNTER — Other Ambulatory Visit: Payer: Self-pay | Admitting: Family Medicine

## 2021-05-11 ENCOUNTER — Other Ambulatory Visit: Payer: Self-pay | Admitting: Family Medicine

## 2021-05-11 DIAGNOSIS — Z1231 Encounter for screening mammogram for malignant neoplasm of breast: Secondary | ICD-10-CM

## 2021-06-04 ENCOUNTER — Ambulatory Visit
Admission: RE | Admit: 2021-06-04 | Discharge: 2021-06-04 | Disposition: A | Discharge status: Home / Self Care | Payer: Medicare Other | Source: Ambulatory Visit | Attending: Family Medicine | Admitting: Family Medicine

## 2021-06-04 DIAGNOSIS — Z1231 Encounter for screening mammogram for malignant neoplasm of breast: Secondary | ICD-10-CM

## 2021-06-05 ENCOUNTER — Other Ambulatory Visit: Payer: Self-pay | Admitting: Family Medicine

## 2021-06-05 DIAGNOSIS — R928 Other abnormal and inconclusive findings on diagnostic imaging of breast: Secondary | ICD-10-CM

## 2021-06-22 ENCOUNTER — Ambulatory Visit: Payer: Medicare Other

## 2021-06-23 ENCOUNTER — Other Ambulatory Visit: Payer: Medicare Other

## 2021-07-07 ENCOUNTER — Ambulatory Visit
Admission: RE | Admit: 2021-07-07 | Discharge: 2021-07-07 | Disposition: A | Discharge status: Home / Self Care | Payer: Medicare Other | Source: Ambulatory Visit | Attending: Family Medicine | Admitting: Family Medicine

## 2021-07-07 DIAGNOSIS — R928 Other abnormal and inconclusive findings on diagnostic imaging of breast: Secondary | ICD-10-CM

## 2021-07-09 ENCOUNTER — Telehealth: Payer: Self-pay | Admitting: Family Medicine

## 2021-07-09 NOTE — Telephone Encounter (Signed)
Vml for pt to call back and shc a HTN fu apt with dr Jerline Pain  ?

## 2021-07-27 ENCOUNTER — Ambulatory Visit (AMBULATORY_SURGERY_CENTER): Payer: Medicare Other

## 2021-07-27 VITALS — Ht 69.0 in | Wt 213.0 lb

## 2021-07-27 DIAGNOSIS — Z86010 Personal history of colonic polyps: Secondary | ICD-10-CM

## 2021-07-27 LAB — HM DIABETES EYE EXAM

## 2021-07-27 MED ORDER — SUTAB 1479-225-188 MG PO TABS: 1.0000 | ORAL_TABLET | ORAL | 0 refills | Status: DC

## 2021-07-27 NOTE — Progress Notes (Signed)
No egg or soy allergy known to patient  No issues known to pt with past sedation with any surgeries or procedures Patient denies ever being told they had issues or difficulty with intubation  No FH of Malignant Hyperthermia Pt is not on diet pills Pt is not on home 02  Pt is not on blood thinners  Pt denies issues with constipation at this time; No A fib or A flutter Medicare coupon to pt in PV today and NO PA's for preps discussed with pt in PV today  Discussed with pt there will be an out-of-pocket cost for prep and that varies from $0 to 70 + dollars - pt verbalized understanding  Pt instructed to use Singlecare.com or GoodRx for a price reduction on prep  PV completed over the phone. Pt verified name, DOB, address and insurance during PV today.  Pt mailed instruction packet with copy of consent form to read and not return, and instructions.  Pt encouraged to call with questions or issues.  Pt has My chart, procedure instructions sent via My Chart.  Insurance confirmed with pt at [facility] today

## 2021-08-20 ENCOUNTER — Encounter: Payer: Self-pay | Admitting: Gastroenterology

## 2021-08-24 ENCOUNTER — Encounter: Payer: Self-pay | Admitting: Gastroenterology

## 2021-08-24 ENCOUNTER — Ambulatory Visit (AMBULATORY_SURGERY_CENTER): Payer: Medicare Other | Admitting: Gastroenterology

## 2021-08-24 VITALS — BP 145/81 | HR 71 | Temp 98.2°F | Resp 19 | Ht 69.0 in | Wt 213.0 lb

## 2021-08-24 DIAGNOSIS — Z86010 Personal history of colonic polyps: Secondary | ICD-10-CM

## 2021-08-24 DIAGNOSIS — D123 Benign neoplasm of transverse colon: Secondary | ICD-10-CM

## 2021-08-24 DIAGNOSIS — D120 Benign neoplasm of cecum: Secondary | ICD-10-CM | POA: Diagnosis not present

## 2021-08-24 DIAGNOSIS — D124 Benign neoplasm of descending colon: Secondary | ICD-10-CM

## 2021-08-24 DIAGNOSIS — Z09 Encounter for follow-up examination after completed treatment for conditions other than malignant neoplasm: Secondary | ICD-10-CM

## 2021-08-24 MED ORDER — SODIUM CHLORIDE 0.9 % IV SOLN: 500.0000 mL | Freq: Once | INTRAVENOUS | Status: DC

## 2021-08-24 NOTE — Progress Notes (Signed)
Pt's states no medical or surgical changes since previsit or office visit. 

## 2021-08-24 NOTE — Patient Instructions (Signed)
Handouts given about polyps and diverticulosis.  Await pathology report.  Resume previous diet.  Continue present medications.  YOU HAD AN ENDOSCOPIC PROCEDURE TODAY AT [facility]:   Refer to the procedure report that was given to you for any specific questions about what was found during the examination.  If the procedure report does not answer your questions, please call your gastroenterologist to clarify.  If you requested that your care partner not be given the details of your procedure findings, then the procedure report has been included in a sealed envelope for you to review at your convenience later.  YOU SHOULD EXPECT: Some feelings of bloating in the abdomen. Passage of more gas than usual.  Walking can help get rid of the air that was put into your GI tract during the procedure and reduce the bloating. If you had a lower endoscopy (such as a colonoscopy or flexible sigmoidoscopy) you may notice spotting of blood in your stool or on the toilet paper. If you underwent a bowel prep for your procedure, you may not have a normal bowel movement for a few days.  Please Note:  You might notice some irritation and congestion in your nose or some drainage.  This is from the oxygen used during your procedure.  There is no need for concern and it should clear up in a day or so.  SYMPTOMS TO REPORT IMMEDIATELY:  Following lower endoscopy (colonoscopy or flexible sigmoidoscopy):  Excessive amounts of blood in the stool  Significant tenderness or worsening of abdominal pains  Swelling of the abdomen that is new, acute  Fever of 100F or higher   For urgent or emergent issues, a gastroenterologist can be reached at any hour by calling [phone]. Do not use MyChart messaging for urgent concerns.    DIET:  We do recommend a small meal at first, but then you may proceed to your regular diet.  Drink plenty of fluids but you should avoid alcoholic beverages for 24  hours.  ACTIVITY:  You should plan to take it easy for the rest of today and you should NOT DRIVE or use heavy machinery until tomorrow (because of the sedation medicines used during the test).    FOLLOW UP: Our staff will call the number listed on your records 24-72 hours following your procedure to check on you and address any questions or concerns that you may have regarding the information given to you following your procedure. If we do not reach you, we will leave a message.  We will attempt to reach you two times.  During this call, we will ask if you have developed any symptoms of COVID 19. If you develop any symptoms (ie: fever, flu-like symptoms, shortness of breath, cough etc.) before then, please call [phone].  If you test positive for Covid 19 in the 2 weeks post procedure, please call and report this information to [place].    If any biopsies were taken you will be contacted by phone or by letter within the next 1-3 weeks.  Please call us at [phone] if you have not heard about the biopsies in 3 weeks.    SIGNATURES/CONFIDENTIALITY: You and/or your care partner have signed paperwork which will be entered into your electronic medical record.  These signatures attest to the fact that that the information above on your After Visit Summary has been reviewed and is understood.  Full responsibility of the confidentiality of this discharge information lies with you and/or your care-partner.

## 2021-08-24 NOTE — Op Note (Signed)
[facility] Patient Name: Shelly Yoder Procedure Date: 08/24/2021 9:02 AM MRN: [id number] Endoscopist: Ladene Artist , MD Age: 76 Referring MD:  Date of Birth: 06-01-1945 Gender: Female Account #: [id number] Procedure:                Colonoscopy Indications:              Surveillance: History of numerous adenomas on last                            colonoscopy (< 3 yrs) Medicines:                Monitored Anesthesia Care Procedure:                Pre-Anesthesia Assessment:                           - Prior to the procedure, a History and Physical                            was performed, and patient medications and                            allergies were reviewed. The patient's tolerance of                            previous anesthesia was also reviewed. The risks                            and benefits of the procedure and the sedation                            options and risks were discussed with the patient.                            All questions were answered, and informed consent                            was obtained. Prior Anticoagulants: The patient has                            taken no previous anticoagulant or antiplatelet                            agents. ASA Grade Assessment: II - A patient with                            mild systemic disease. After reviewing the risks                            and benefits, the patient was deemed in                            satisfactory condition to undergo the procedure.  After obtaining informed consent, the colonoscope                            was passed under direct vision. Throughout the                            procedure, the patient's blood pressure, pulse, and                            oxygen saturations were monitored continuously. The                            CF [id number] #[id number] was introduced through the anus                            and advanced to the the cecum,  identified by                            appendiceal orifice and ileocecal valve. The                            ileocecal valve, appendiceal orifice, and rectum                            were photographed. The quality of the bowel                            preparation was good after substantial lavage,                            suction. The colonoscopy was performed without                            difficulty. The patient tolerated the procedure                            well. Scope In: 1:91:47 AM Scope Out: 9:35:24 AM Scope Withdrawal Time: 0 hours 15 minutes 42 seconds  Total Procedure Duration: 0 hours 19 minutes 6 seconds  Findings:                 The perianal and digital rectal examinations were                            normal.                           Five pedunculated and sessile polyps were found in                            the descending colon (2), transverse colon (2) and                            cecum (1). The polyps were 6 to 8 mm in size. These  polyps were removed with a cold snare. Resection                            and retrieval were complete.                           A few small-mouthed diverticula were found in the                            left colon. There was no evidence of diverticular                            bleeding.                           The exam was otherwise without abnormality on                            direct and retroflexion views. Complications:            No immediate complications. Estimated blood loss:                            None. Estimated Blood Loss:     Estimated blood loss: none. Impression:               - Five 6 to 8 mm polyps in the descending colon, in                            the transverse colon and in the cecum, removed with                            a cold snare. Resected and retrieved.                           - Mild diverticulosis in the left colon.                           -  The examination was otherwise normal on direct                            and retroflexion views. Recommendation:           - Repeat colonoscopy, likely 3 years, after studies                            are complete for surveillance of multiple polyps.                           - Patient has a contact number available for                            emergencies. The signs and symptoms of potential                            delayed  complications were discussed with the                            patient. Return to normal activities tomorrow.                            Written discharge instructions were provided to the                            patient.                           - High fiber diet.                           - Continue present medications.                           - Await pathology results. Ladene Artist, MD 08/24/2021 9:39:39 AM This report has been signed electronically.

## 2021-08-24 NOTE — Progress Notes (Signed)
History & Physical  Primary Care Physician:  Vivi Barrack, MD Primary Gastroenterologist: Lucio Edward, MD  CHIEF COMPLAINT:  Personal history of colon polyps   HPI: Shelly Yoder is a 76 y.o. female with a personal history of multiple adenomatous colon polyps for surveillance colonoscopy.    Past Medical History:  Diagnosis Date   Anemia    Anxiety    situational - no meds   Arthritis    possibly    Bilateral hearing loss 03/29/2018   no hearing aids   Cataracts, bilateral    forming both eyes - just watching   Colon polyps    Depression    no meds   Diabetes mellitus without complication ([place]) 7062    Diet controlled  no meds   Heart murmur    no problems   Hyperlipidemia    Hyperlipidemia associated with type 2 diabetes mellitus ([place]) 03/29/2018   Hypertension    no meds, working on diet.weight loss   Hypertension associated with diabetes ([place]) 03/29/2018   Lipoma    Lipoma of torso 03/29/2018   Refusal of blood transfusions as patient is Jehovah's Witness 12/04/2014   Seasonal allergies    Type 2 diabetes mellitus with hyperglycemia, without long-term current use of insulin ([place]) 03/29/2018   Vitamin D deficiency 03/29/2018    Past Surgical History:  Procedure Laterality Date   ABDOMINAL HYSTERECTOMY  1996   partial    BREAST BIOPSY Right    COLONOSCOPY  06/12/2015   COLONOSCOPY  2021   MS-MAC-sutab-(good)-10 polyps-TA/HPP/tics   LIPOMA EXCISION     upper back   POLYPECTOMY  2021   HPP/TA    Prior to Admission medications   Medication Sig Start Date End Date Taking? Authorizing Provider  CALCIUM PO Take 1 tablet by mouth daily at 6 (six) AM.   Yes [provider]  Cyanocobalamin (VITAMIN B 12 PO) Take 1 tablet by mouth daily at 6 (six) AM.   Yes [provider]  glucose blood (ONETOUCH VERIO) test strip USE TO CHECK BLOOD SUGAR DAILY AND AS NEEDED 04/14/21  Yes Vivi Barrack, MD  Lancets (ONETOUCH DELICA PLUS [id number]) MISC  USE TO CHECK BLOOD SUGAR DAILY AND AS NEEDED 04/14/21  Yes Vivi Barrack, MD  Multiple Vitamin (MULTI-VITAMINS) TABS Take 1 tablet by mouth daily at 6 (six) AM.   Yes [provider]  VITAMIN D, CHOLECALCIFEROL, PO Take 1 tablet by mouth daily at 6 (six) AM. daily   Yes [provider]    Current Outpatient Medications  Medication Sig Dispense Refill   CALCIUM PO Take 1 tablet by mouth daily at 6 (six) AM.     Cyanocobalamin (VITAMIN B 12 PO) Take 1 tablet by mouth daily at 6 (six) AM.     glucose blood (ONETOUCH VERIO) test strip USE TO CHECK BLOOD SUGAR DAILY AND AS NEEDED 100 strip 4   Lancets (ONETOUCH DELICA PLUS [id number]) MISC USE TO CHECK BLOOD SUGAR DAILY AND AS NEEDED 100 each 4   Multiple Vitamin (MULTI-VITAMINS) TABS Take 1 tablet by mouth daily at 6 (six) AM.     VITAMIN D, CHOLECALCIFEROL, PO Take 1 tablet by mouth daily at 6 (six) AM. daily     Current Facility-Administered Medications  Medication Dose Route Frequency Provider Last Rate Last Admin   0.9 %  sodium chloride infusion  500 mL Intravenous Once Ladene Artist, MD        Allergies as of 08/24/2021 -  Review Complete 08/24/2021  Allergen Reaction Noted   Atorvastatin Other (See Comments) 01/23/2016   Hydrocodone-acetaminophen Other (See Comments) 02/10/2016    Family History  Problem Relation Age of Onset   Diabetes Mother    Heart attack Mother    Hyperlipidemia Mother    Heart disease Mother 1   Heart block Mother    Kidney disease Mother    Alcohol abuse Father    Diabetes Father    Heart disease Father    Kidney disease Father    Breast cancer Sister    Diabetes Brother    Hypertension Brother    Kidney disease Brother    Colon cancer Neg Hx    Esophageal cancer Neg Hx    Stomach cancer Neg Hx    Rectal cancer Neg Hx    Colon polyps Neg Hx     Social History   Socioeconomic History   Marital status: Married    Spouse name: Not on file   Number of children: 3   Years  of education: Not on file   Highest education level: Not on file  Occupational History   Occupation: Retired    Comment: Publishing rights manager   Tobacco Use   Smoking status: Former    Types: Cigarettes    Quit date: 06/15/1975    Years since quitting: 46.2   Smokeless tobacco: Never  Vaping Use   Vaping Use: Never used  Substance and Sexual Activity   Alcohol use: Not Currently    Alcohol/week: 0.0 - 1.0 standard drinks of alcohol    Comment: occasionally Wine/Beer   Drug use: No   Sexual activity: Not Currently    Birth control/protection: Surgical    Comment: Hysterectomy  Other Topics Concern   Not on file  Social History Narrative   Not on file   Social Determinants of Health   Financial Resource Strain: Low Risk  (02/16/2021)   Overall Financial Resource Strain (CARDIA)    Difficulty of Paying Living Expenses: Not hard at all  Food Insecurity: No Food Insecurity (02/16/2021)   Hunger Vital Sign    Worried About Running Out of Food in the Last Year: Never true    Ran Out of Food in the Last Year: Never true  Transportation Needs: No Transportation Needs (02/16/2021)   PRAPARE - Hydrologist (Medical): No    Lack of Transportation (Non-Medical): No  Physical Activity: Inactive (02/16/2021)   Exercise Vital Sign    Days of Exercise per Week: 0 days    Minutes of Exercise per Session: 0 min  Stress: No Stress Concern Present (02/16/2021)   [place]    Feeling of Stress : Only a little  Social Connections: Moderately Integrated (02/16/2021)   Social Connection and Isolation Panel [NHANES]    Frequency of Communication with Friends and Family: More than three times a week    Frequency of Social Gatherings with Friends and Family: More than three times a week    Attends Religious Services: More than 4 times per year    Active Member of [employer] or Organizations: No    Attends  Archivist Meetings: Never    Marital Status: Married  Human resources officer Violence: Not At Risk (02/16/2021)   Humiliation, Afraid, Rape, and Kick questionnaire    Fear of Current or Ex-Partner: No    Emotionally Abused: No    Physically Abused: No    Sexually Abused:  No    Review of Systems:  All systems reviewed an negative except where noted in HPI.  Gen: Denies any fever, chills, sweats, anorexia, fatigue, weakness, malaise, weight loss, and sleep disorder CV: Denies chest pain, angina, palpitations, syncope, orthopnea, PND, peripheral edema, and claudication. Resp: Denies dyspnea at rest, dyspnea with exercise, cough, sputum, wheezing, coughing up blood, and pleurisy. GI: Denies vomiting blood, jaundice, and fecal incontinence.   Denies dysphagia or odynophagia. GU : Denies urinary burning, blood in urine, urinary frequency, urinary hesitancy, nocturnal urination, and urinary incontinence. MS: Denies joint pain, limitation of movement, and swelling, stiffness, low back pain, extremity pain. Denies muscle weakness, cramps, atrophy.  Derm: Denies rash, itching, dry skin, hives, moles, warts, or unhealing ulcers.  Psych: Denies depression, anxiety, memory loss, suicidal ideation, hallucinations, paranoia, and confusion. Heme: Denies bruising, bleeding, and enlarged lymph nodes. Neuro:  Denies any headaches, dizziness, paresthesias. Endo:  Denies any problems with DM, thyroid, adrenal function.   Physical Exam: General:  Alert, well-developed, in NAD Head:  Normocephalic and atraumatic. Eyes:  Sclera clear, no icterus.   Conjunctiva pink. Ears:  Normal auditory acuity. Mouth:  No deformity or lesions.  Neck:  Supple; no masses . Lungs:  Clear throughout to auscultation.   No wheezes, crackles, or rhonchi. No acute distress. Heart:  Regular rate and rhythm; no murmurs. Abdomen:  Soft, nondistended, nontender. No masses, hepatomegaly. No obvious masses.  Normal bowel .     Rectal:  Deferred   Msk:  Symmetrical without gross deformities.. Pulses:  Normal pulses noted. Extremities:  Without edema. Neurologic:  Alert and  oriented x4;  grossly normal neurologically. Skin:  Intact without significant lesions or rashes. Cervical Nodes:  No significant cervical adenopathy. Psych:  Alert and cooperative. Normal mood and affect.  Impression / Plan:   Personal history of multiple adenomatous colon polyps for surveillance colonoscopy.  Pricilla Riffle. Fuller Plan  08/24/2021, 9:08 AM See Shea Evans, [facility], to contact our on call provider

## 2021-08-24 NOTE — Progress Notes (Signed)
Sedate, gd SR, tolerated procedure well, VSS, report to RN 

## 2021-08-24 NOTE — Progress Notes (Signed)
Called to room to assist during endoscopic procedure.  Patient ID and intended procedure confirmed with present staff. Received instructions for my participation in the procedure from the performing physician.  

## 2021-08-25 ENCOUNTER — Telehealth: Payer: Self-pay

## 2021-08-25 NOTE — Telephone Encounter (Signed)
  Follow up Call-     08/24/2021    8:26 AM 07/16/2019   10:39 AM  Call back number  Post procedure Call Back phone  # [phone]  Permission to leave phone message Yes Yes     Patient questions:  Do you have a fever, pain , or abdominal swelling? No. Pain Score  0 *  Have you tolerated food without any problems? Yes.    Have you been able to return to your normal activities? Yes.    Do you have any questions about your discharge instructions: Diet   No. Medications  No. Follow up visit  No.  Do you have questions or concerns about your Care? No.  Actions: * If pain score is 4 or above: No action needed, pain <4.

## 2021-08-28 ENCOUNTER — Other Ambulatory Visit (HOSPITAL_BASED_OUTPATIENT_CLINIC_OR_DEPARTMENT_OTHER): Payer: Self-pay

## 2021-08-28 MED ORDER — ZOSTER VAC RECOMB ADJUVANTED 50 MCG/0.5ML IM SUSR
INTRAMUSCULAR | 0 refills | Status: DC
  Filled 2021-08-28: qty 0.5, 1d supply, fill #0

## 2021-09-01 ENCOUNTER — Encounter: Payer: Self-pay | Admitting: Gastroenterology

## 2021-09-23 ENCOUNTER — Encounter: Payer: Self-pay | Admitting: Family Medicine

## 2021-09-29 ENCOUNTER — Ambulatory Visit (INDEPENDENT_AMBULATORY_CARE_PROVIDER_SITE_OTHER): Payer: Medicare Other | Admitting: Family Medicine

## 2021-09-29 ENCOUNTER — Encounter: Payer: Self-pay | Admitting: Family Medicine

## 2021-09-29 VITALS — BP 170/89 | HR 75 | Temp 97.7°F | Ht 69.0 in | Wt 219.0 lb

## 2021-09-29 DIAGNOSIS — E1165 Type 2 diabetes mellitus with hyperglycemia: Secondary | ICD-10-CM | POA: Diagnosis not present

## 2021-09-29 DIAGNOSIS — I152 Hypertension secondary to endocrine disorders: Secondary | ICD-10-CM | POA: Diagnosis not present

## 2021-09-29 DIAGNOSIS — E1159 Type 2 diabetes mellitus with other circulatory complications: Secondary | ICD-10-CM | POA: Diagnosis not present

## 2021-09-29 LAB — COMPREHENSIVE METABOLIC PANEL
ALT: 13 U/L (ref 0–35)
AST: 22 U/L (ref 0–37)
Albumin: 4 g/dL (ref 3.5–5.2)
Alkaline Phosphatase: 62 U/L (ref 39–117)
BUN: 10 mg/dL (ref 6–23)
CO2: 29 mEq/L (ref 19–32)
Calcium: 9.3 mg/dL (ref 8.4–10.5)
Chloride: 104 mEq/L (ref 96–112)
Creatinine, Ser: 0.65 mg/dL (ref 0.40–1.20)
GFR: 86.13 mL/min (ref >60.00)
Glucose, Bld: 107 mg/dL — ABNORMAL HIGH (ref 70–99)
Potassium: 3.9 mEq/L (ref 3.5–5.1)
Sodium: 139 mEq/L (ref 135–145)
Total Bilirubin: 0.3 mg/dL (ref 0.2–1.2)
Total Protein: 7.5 g/dL (ref 6.0–8.3)

## 2021-09-29 LAB — CBC
HCT: 35.9 % — ABNORMAL LOW (ref 36.0–46.0)
Hemoglobin: 11.8 g/dL — ABNORMAL LOW (ref 12.0–15.0)
MCHC: 32.8 g/dL (ref 30.0–36.0)
MCV: 87.1 fl (ref 78.0–100.0)
Platelets: 146 10*3/uL — ABNORMAL LOW (ref 150.0–400.0)
RBC: 4.12 Mil/uL (ref 3.87–5.11)
RDW: 13.5 % (ref 11.5–15.5)
WBC: 5.1 10*3/uL (ref 4.0–10.5)

## 2021-09-29 LAB — HEMOGLOBIN A1C: Hgb A1c MFr Bld: 6.9 % — ABNORMAL HIGH (ref 4.6–6.5)

## 2021-09-29 NOTE — Assessment & Plan Note (Signed)
Check A1c.  Not currently on medications.  Discussed diet and exercise.

## 2021-09-29 NOTE — Assessment & Plan Note (Signed)
Blood pressure is above goal today though they are typically well controlled at home in the 130s over 70s to 80s.  She does not wish to start medications at this point.  They will continue home monitoring and let us know if persistently elevated at home.

## 2021-09-29 NOTE — Progress Notes (Signed)
   Shelly Yoder is a 76 y.o. female who presents today for an office visit.  Assessment/Plan:  Chronic Problems Addressed Today: Type 2 diabetes mellitus with hyperglycemia, without long-term current use of insulin (HCC) Check A1c.  Not currently on medications.  Discussed diet and exercise.  Hypertension associated with diabetes ([place]) Blood pressure is above goal today though they are typically well controlled at home in the 130s over 70s to 80s.  She does not wish to start medications at this point.  They will continue home monitoring and let us know if persistently elevated at home.     Subjective:  HPI:  See A/p for status of chronic conditions.         Objective:  Physical Exam: BP (!) 154/98   Pulse 75   Temp 97.7 F (36.5 C) (Temporal)   Ht '5\' 9"'$  (1.753 m)   Wt 219 lb (99.3 kg)   LMP  (LMP Unknown)   SpO2 97%   BMI 32.34 kg/m   Gen: No acute distress, resting comfortably CV: Regular rate and rhythm with no murmurs appreciated Pulm: Normal work of breathing, clear to auscultation bilaterally with no crackles, wheezes, or rhonchi Neuro: Grossly normal, moves all extremities Psych: Normal affect and thought content      Caison Hearn M. Jerline Pain, MD 09/29/2021 11:09 AM

## 2021-09-29 NOTE — Patient Instructions (Signed)
It was very nice to see you today!  We will check blood work today.  Keep an eye on your blood pressure and let us know if it is persistently 140/90 or higher.  We will see you back in 6 to 12 months.  Please come back to see [place] sooner if needed.  Take care, Dr Jerline Pain  PLEASE NOTE:  If you had any lab tests please let us know if you have not heard back within a few days. You may see your results on mychart before we have a chance to review them but we will give you a call once they are reviewed by [place]. If we ordered any referrals today, please let us know if you have not heard from their office within the next week.   Please try these tips to maintain a healthy lifestyle:  Eat at least 3 REAL meals and 1-2 snacks per day.  Aim for no more than 5 hours between eating.  If you eat breakfast, please do so within one hour of getting up.   Each meal should contain half fruits/vegetables, one quarter protein, and one quarter carbs (no bigger than a computer mouse)  Cut down on sweet beverages. This includes juice, soda, and sweet tea.   Drink at least 1 glass of water with each meal and aim for at least 8 glasses per day  Exercise at least 150 minutes every week.

## 2021-10-02 NOTE — Progress Notes (Signed)
Please inform patient of the following:  Labs are stable.  Do not need to make any changes to treatment plan at this time.  We can recheck in 3 to 6 months.

## 2021-10-14 ENCOUNTER — Ambulatory Visit
Admission: RE | Admit: 2021-10-14 | Discharge: 2021-10-14 | Disposition: A | Discharge status: Home / Self Care | Payer: Medicare Other | Source: Ambulatory Visit | Attending: Family Medicine | Admitting: Family Medicine

## 2021-10-14 ENCOUNTER — Encounter (INDEPENDENT_AMBULATORY_CARE_PROVIDER_SITE_OTHER): Payer: Self-pay

## 2021-10-14 DIAGNOSIS — E2839 Other primary ovarian failure: Secondary | ICD-10-CM

## 2021-10-14 DIAGNOSIS — Z780 Asymptomatic menopausal state: Secondary | ICD-10-CM | POA: Diagnosis not present

## 2021-10-14 NOTE — Progress Notes (Signed)
Please inform patient of the following:  Her bone density scan showed that she has mild thinning of the bones called osteopenia.  Do not need to make any medication changes.  She should continue calcium and vitamin D supplementation.  We can recheck this again in a couple of years.

## 2021-11-30 ENCOUNTER — Encounter: Payer: Self-pay | Admitting: *Deleted

## 2021-12-28 ENCOUNTER — Other Ambulatory Visit (HOSPITAL_BASED_OUTPATIENT_CLINIC_OR_DEPARTMENT_OTHER): Payer: Self-pay

## 2021-12-28 MED ORDER — COMIRNATY 30 MCG/0.3ML IM SUSY
PREFILLED_SYRINGE | INTRAMUSCULAR | 0 refills | Status: DC
  Filled 2021-12-28: qty 0.3, 1d supply, fill #0

## 2022-01-29 ENCOUNTER — Other Ambulatory Visit (HOSPITAL_BASED_OUTPATIENT_CLINIC_OR_DEPARTMENT_OTHER): Payer: Self-pay

## 2022-01-29 MED ORDER — ZOSTER VAC RECOMB ADJUVANTED 50 MCG/0.5ML IM SUSR
INTRAMUSCULAR | 0 refills | Status: DC
  Filled 2022-01-29: qty 1, 1d supply, fill #0

## 2022-02-15 ENCOUNTER — Encounter: Payer: Self-pay | Admitting: Family Medicine

## 2022-02-15 ENCOUNTER — Ambulatory Visit (INDEPENDENT_AMBULATORY_CARE_PROVIDER_SITE_OTHER): Payer: Medicare Other | Admitting: Family Medicine

## 2022-02-15 VITALS — BP 181/98 | HR 75 | Temp 98.4°F | Ht 69.0 in | Wt 218.0 lb

## 2022-02-15 DIAGNOSIS — E1159 Type 2 diabetes mellitus with other circulatory complications: Secondary | ICD-10-CM | POA: Diagnosis not present

## 2022-02-15 DIAGNOSIS — L720 Epidermal cyst: Secondary | ICD-10-CM | POA: Diagnosis not present

## 2022-02-15 DIAGNOSIS — E1165 Type 2 diabetes mellitus with hyperglycemia: Secondary | ICD-10-CM | POA: Diagnosis not present

## 2022-02-15 DIAGNOSIS — L989 Disorder of the skin and subcutaneous tissue, unspecified: Secondary | ICD-10-CM

## 2022-02-15 DIAGNOSIS — I152 Hypertension secondary to endocrine disorders: Secondary | ICD-10-CM | POA: Diagnosis not present

## 2022-02-15 NOTE — Assessment & Plan Note (Signed)
Not currently on any medications.  She will come back soon for CPE and we can check A1c at that time.

## 2022-02-15 NOTE — Progress Notes (Signed)
   Shelly Yoder is a 76 y.o. female who presents today for an office visit.  Assessment/Plan:  New/Acute Problems: Skin Lesion Shave biopsy from today.  See below procedure note.  She tolerated.  Discussed care after.  Likely has superficial sebaceous cyst.  May need Derm follow-up depending on results.  Chronic Problems Addressed Today: Type 2 diabetes mellitus with hyperglycemia, without long-term current use of insulin (HCC) Not currently on any medications.  She will come back soon for CPE and we can check A1c at that time.  Hypertension associated with diabetes ([place]) Elevated today initially 166/87 today.  She is typically well-controlled at home in the 130s over 70s to 80s.  She deferred starting medications today.  She will continue to monitor at home and let us know if persistently elevated.  Her flag signs or symptoms.  We discussed reasons to return to care.     Subjective:  HPI:  Patient here with mass on her back. This has been going on for years. Seems to be increasing in size. No pain.  She has had lipomas removed in the past.  No pain.  No bleeding  See a/p for status of chronic conditions.        Objective:  Physical Exam: BP (!) 181/98   Pulse 75   Temp 98.4 F (36.9 C) (Temporal)   Ht '5\' 9"'$  (1.753 m)   Wt 218 lb (98.9 kg)   LMP  (LMP Unknown)   SpO2 99%   BMI 32.19 kg/m   Gen: No acute distress, resting comfortably CV: Regular rate and rhythm with no murmurs appreciated Pulm: Normal work of breathing, clear to auscultation bilaterally with no crackles, wheezes, or rhonchi Skin: Approximately 1.5 cm nodular hyperpigmented lesion on right lower back Neuro: Grossly normal, moves all extremities Psych: Normal affect and thought content   Shave Biopsy Procedure Note  Pre-operative Diagnosis: Suspicious lesion  Post-operative Diagnosis: same  Locations:right lower back   Indications: diagnostic   Anesthesia: Lidocaine 1% without epinephrine without  added sodium bicarbonate  Procedure Details  History of allergy to iodine: no  Patient informed of the risks (including bleeding and infection) and benefits of the  procedure and Verbal informed consent obtained.  The lesion and surrounding area were given a sterile prep using betadyne and draped in the usual sterile fashion. A scalpel was used to shave an area of skin approximately 1cm by 1cm.  After shave biopsy was performed a small area of what appeared to be a sebaceous cyst was left in the wound.  This was removed with sterile forceps and placed in the pathology specific cup. Hemostasis achieved with silver nitrate. Antibiotic ointment and a sterile dressing applied.  The specimen was sent for pathologic examination. The patient tolerated the procedure well.  EBL: 3 ml  Condition: Stable  Complications: None.  Plan: 1. Instructed to keep the wound dry and covered for 24-48h and clean thereafter. 2. Warning signs of infection were reviewed.   3. Recommended that the patient use OTC analgesics as needed for pain.       Algis Greenhouse. Jerline Pain, MD 02/15/2022 12:27 PM

## 2022-02-15 NOTE — Patient Instructions (Addendum)
It was very nice to see you today!  We reviewed this bulging likely.  It was probably a cyst.  This should not come back.  Please keep the area clean and dry.  Let us know if you have any signs of infection.  Pathologist will look at the sample we took today to make sure there is nothing else that we need to do.  Please come back soon for your annual physical with labs.   Take care, Dr Jerline Pain  PLEASE NOTE:  If you had any lab tests please let us know if you have not heard back within a few days. You may see your results on mychart before we have a chance to review them but we will give you a call once they are reviewed by [place]. If we ordered any referrals today, please let us know if you have not heard from their office within the next week.   Please try these tips to maintain a healthy lifestyle:  Eat at least 3 REAL meals and 1-2 snacks per day.  Aim for no more than 5 hours between eating.  If you eat breakfast, please do so within one hour of getting up.   Each meal should contain half fruits/vegetables, one quarter protein, and one quarter carbs (no bigger than a computer mouse)  Cut down on sweet beverages. This includes juice, soda, and sweet tea.   Drink at least 1 glass of water with each meal and aim for at least 8 glasses per day  Exercise at least 150 minutes every week.

## 2022-02-15 NOTE — Assessment & Plan Note (Signed)
Elevated today initially 166/87 today.  She is typically well-controlled at home in the 130s over 70s to 80s.  She deferred starting medications today.  She will continue to monitor at home and let us know if persistently elevated.  Her flag signs or symptoms.  We discussed reasons to return to care.

## 2022-02-23 NOTE — Progress Notes (Signed)
Please inform patient of the following:  Biopsy report confirms that we removed a cyst.  This is benign and should not cause her any issues in the future.  Do not need to do any further management at this time.

## 2022-04-13 ENCOUNTER — Encounter: Payer: Self-pay | Admitting: Pharmacist

## 2022-04-19 ENCOUNTER — Ambulatory Visit: Payer: Medicare Other | Admitting: Family Medicine

## 2022-05-03 ENCOUNTER — Other Ambulatory Visit: Payer: Self-pay | Admitting: Family Medicine

## 2022-05-03 DIAGNOSIS — Z1231 Encounter for screening mammogram for malignant neoplasm of breast: Secondary | ICD-10-CM

## 2022-05-04 ENCOUNTER — Encounter: Payer: Self-pay | Admitting: Pharmacist

## 2022-05-10 ENCOUNTER — Ambulatory Visit (INDEPENDENT_AMBULATORY_CARE_PROVIDER_SITE_OTHER): Payer: Medicare Other | Admitting: Family Medicine

## 2022-05-10 ENCOUNTER — Encounter: Payer: Self-pay | Admitting: Family Medicine

## 2022-05-10 VITALS — BP 178/79 | HR 77 | Temp 98.0°F | Ht 69.0 in | Wt 214.8 lb

## 2022-05-10 DIAGNOSIS — E1159 Type 2 diabetes mellitus with other circulatory complications: Secondary | ICD-10-CM | POA: Diagnosis not present

## 2022-05-10 DIAGNOSIS — I152 Hypertension secondary to endocrine disorders: Secondary | ICD-10-CM | POA: Diagnosis not present

## 2022-05-10 MED ORDER — AMLODIPINE BESYLATE 5 MG PO TABS: 5.0000 mg | ORAL_TABLET | Freq: Every day | ORAL | 3 refills | Status: DC

## 2022-05-10 NOTE — Patient Instructions (Signed)
It was very nice to see you today!  We will start low-dose amlodipine to help with the blood pressure.  Please continue to monitor at home.  We removed the cyst off of your back last time.  This was removed completely.  You should not have any issues with recurrence.  Please come back in a couple of weeks for your annual checkup.  We can check labs at that time.  Take care, Dr Jerline Pain  PLEASE NOTE:  If you had any lab tests, please let us know if you have not heard back within a few days. You may see your results on mychart before we have a chance to review them but we will give you a call once they are reviewed by [place].   If we ordered any referrals today, please let us know if you have not heard from their office within the next week.   If you had any urgent prescriptions sent in today, please check with the pharmacy within an hour of our visit to make sure the prescription was transmitted appropriately.   Please try these tips to maintain a healthy lifestyle:  Eat at least 3 REAL meals and 1-2 snacks per day.  Aim for no more than 5 hours between eating.  If you eat breakfast, please do so within one hour of getting up.   Each meal should contain half fruits/vegetables, one quarter protein, and one quarter carbs (no bigger than a computer mouse)  Cut down on sweet beverages. This includes juice, soda, and sweet tea.   Drink at least 1 glass of water with each meal and aim for at least 8 glasses per day  Exercise at least 150 minutes every week.

## 2022-05-10 NOTE — Assessment & Plan Note (Signed)
Initially elevated to 173/81.  Her home readings are typically in the 130s over 70s to 80s.  She has had persistently elevated readings at her last few office visits here as well as at her dentist office.  May have whitecoat hypertension.  Will also asked patient to bring in blood pressure cuff to her next office visit so that we can verify that it is accurate.  She has been on antihypertensives in the past.  She is reluctant to restart however is agreeable to this plan.  We discussed treatment options.  We will start amlodipine 5 mg daily.  We discussed potential side effects.  She will come back in a couple weeks for recheck.  She can continue to monitor at home.

## 2022-05-10 NOTE — Progress Notes (Signed)
   Shelly Yoder is a 77 y.o. female who presents today for an office visit.  Assessment/Plan:  New/Acute Problems: Sebaceous Cyst Doing well status post removal.  Did discuss with patient she has a small amount of scar tissue in the area that should improve over the next several months.  Chronic Problems Addressed Today: Hypertension associated with diabetes ([place]) Initially elevated to 173/81.  Her home readings are typically in the 130s over 70s to 80s.  She has had persistently elevated readings at her last few office visits here as well as at her dentist office.  May have whitecoat hypertension.  Will also asked patient to bring in blood pressure cuff to her next office visit so that we can verify that it is accurate.  She has been on antihypertensives in the past.  She is reluctant to restart however is agreeable to this plan.  We discussed treatment options.  We will start amlodipine 5 mg daily.  We discussed potential side effects.  She will come back in a couple weeks for recheck.  She can continue to monitor at home.    Subjective:  HPI:  See A/p for status of chronic conditions.  Her main concern today is elevated BP reading. She was recently at her dentist office and BP was in the 170s/80s and she was told that she needed to get it lower. She is monitoring at home and BP is usually in the 130s/80s range.   At our last visit, we removed a sebaceous cyst.  She has been doing well with this.       Objective:  Physical Exam: BP (!) 178/79   Pulse 77   Temp 98 F (36.7 C) (Temporal)   Ht '5\' 9"'$  (1.753 m)   Wt 214 lb 12.8 oz (97.4 kg)   LMP  (LMP Unknown)   SpO2 99%   BMI 31.72 kg/m   Gen: No acute distress, resting comfortably CV: Regular rate and rhythm with no murmurs appreciated Pulm: Normal work of breathing, clear to auscultation bilaterally with no crackles, wheezes, or rhonchi SKin: Site of cyst removal on right lower back appears to be healing normally.  No signs of  recurrence. Neuro: Grossly normal, moves all extremities Psych: Normal affect and thought content      Doyce Stonehouse M. Jerline Pain, MD 05/10/2022 10:03 AM

## 2022-05-18 IMAGING — MG MM DIGITAL SCREENING BILAT W/ TOMO AND CAD
8 series · 8 of 24 positions shown · non-contrast
Comparison: Previous exam(s).

CLINICAL DATA: Screening.

EXAM:
DIGITAL SCREENING BILATERAL MAMMOGRAM WITH TOMOSYNTHESIS AND CAD
TECHNIQUE: Bilateral screening digital craniocaudal and mediolateral oblique
mammograms were obtained. Bilateral screening digital breast
tomosynthesis was performed. The images were evaluated with
computer-aided detection.

[R CC synth-2D]
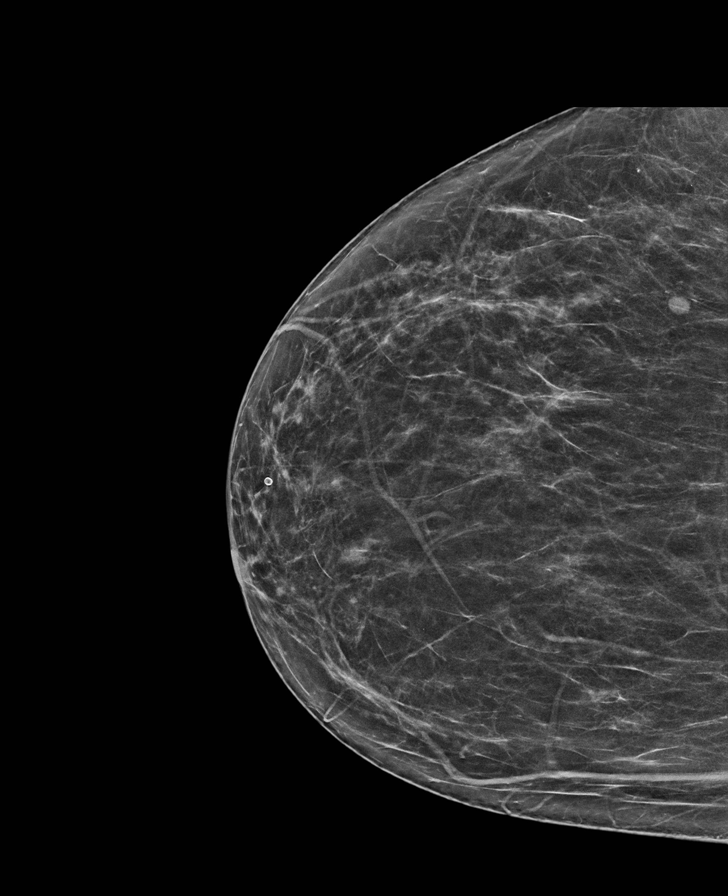

[R MLO synth-2D]
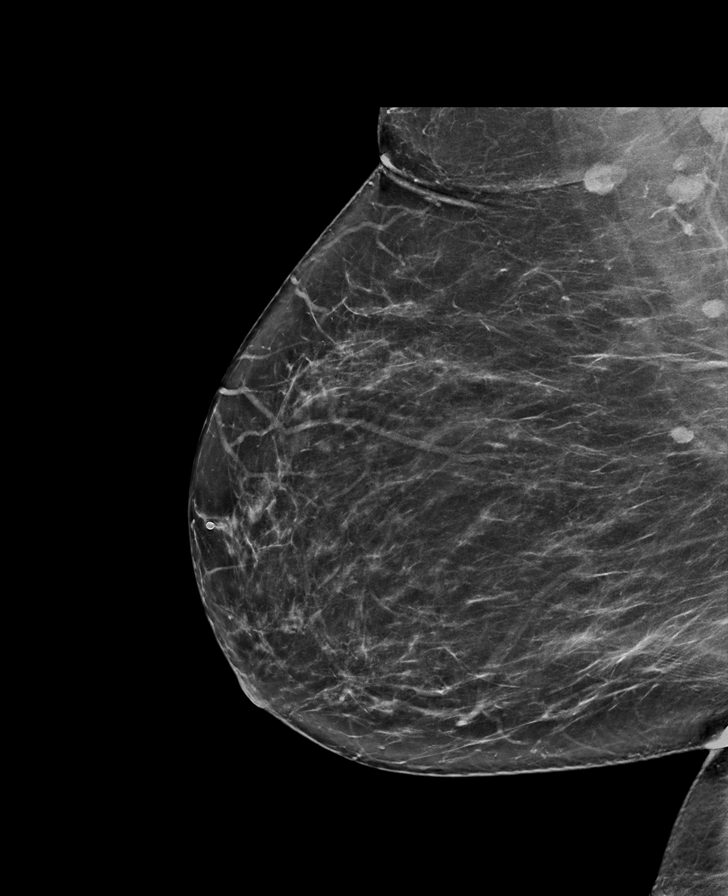

[L CC synth-2D]
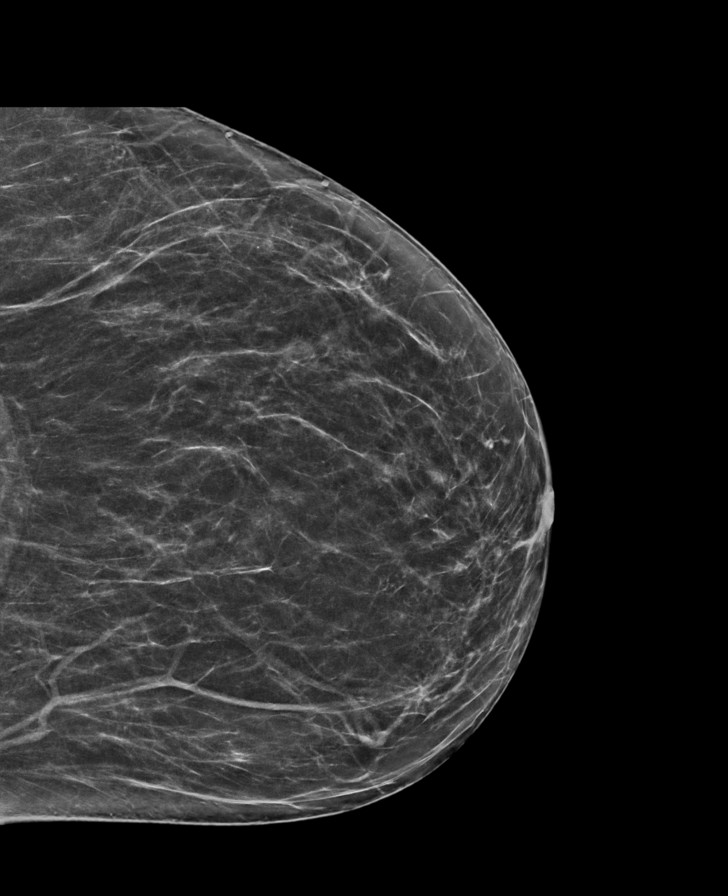

[L MLO synth-2D]
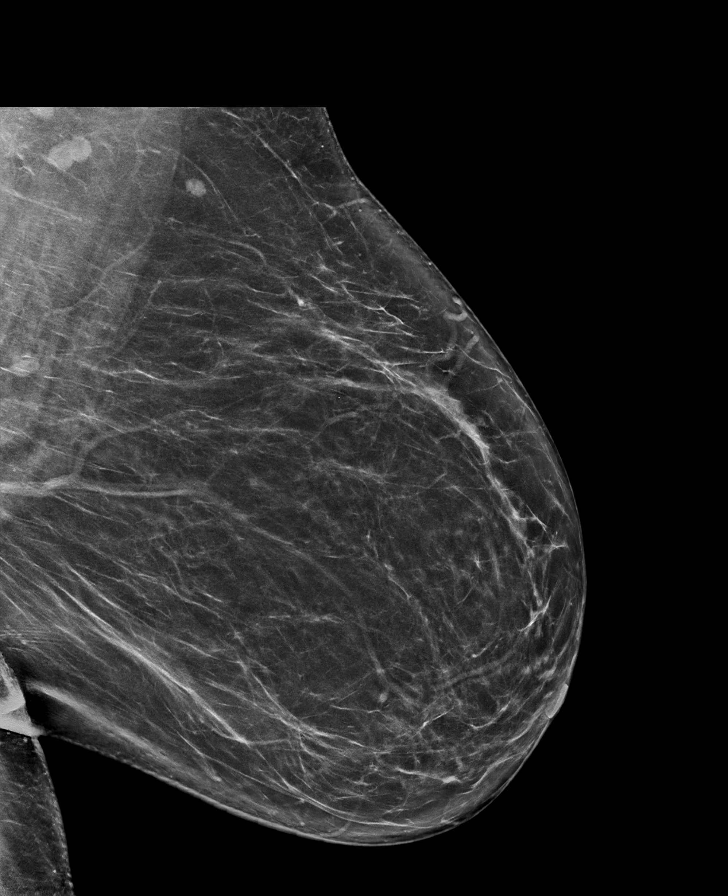

[L CC tomo · tomo slice 36/71.0]
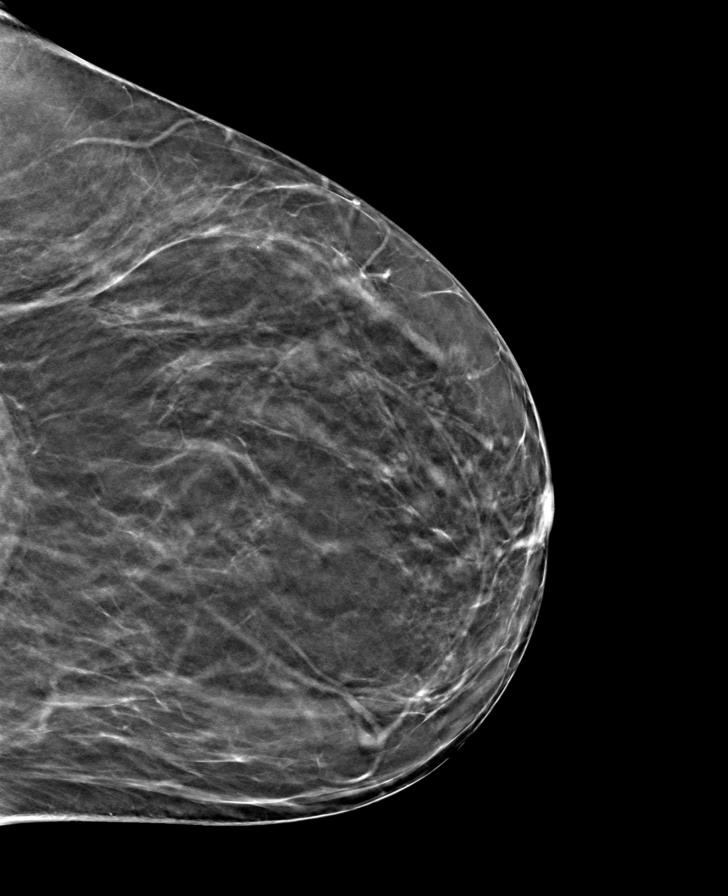

[L MLO tomo · tomo slice 42/83.0]
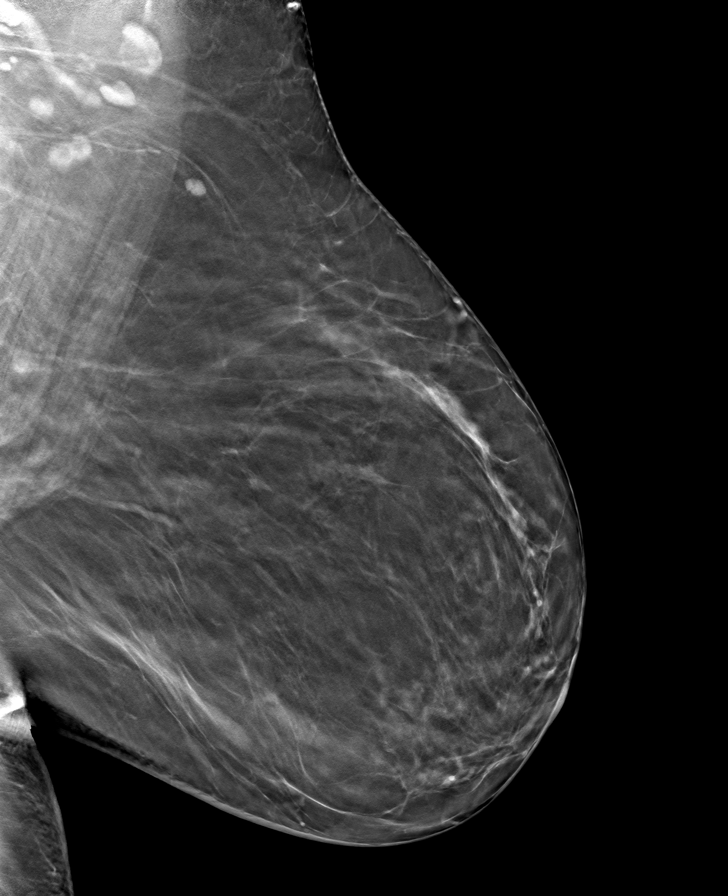

[R CC tomo · tomo slice 35/69.0]
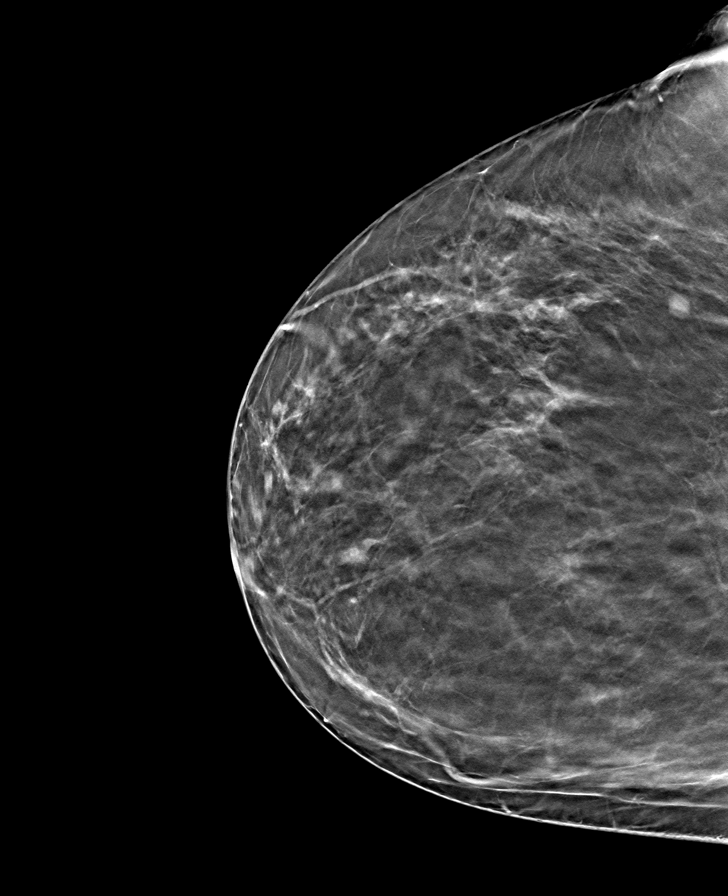

[R MLO tomo · tomo slice 41/80.0]
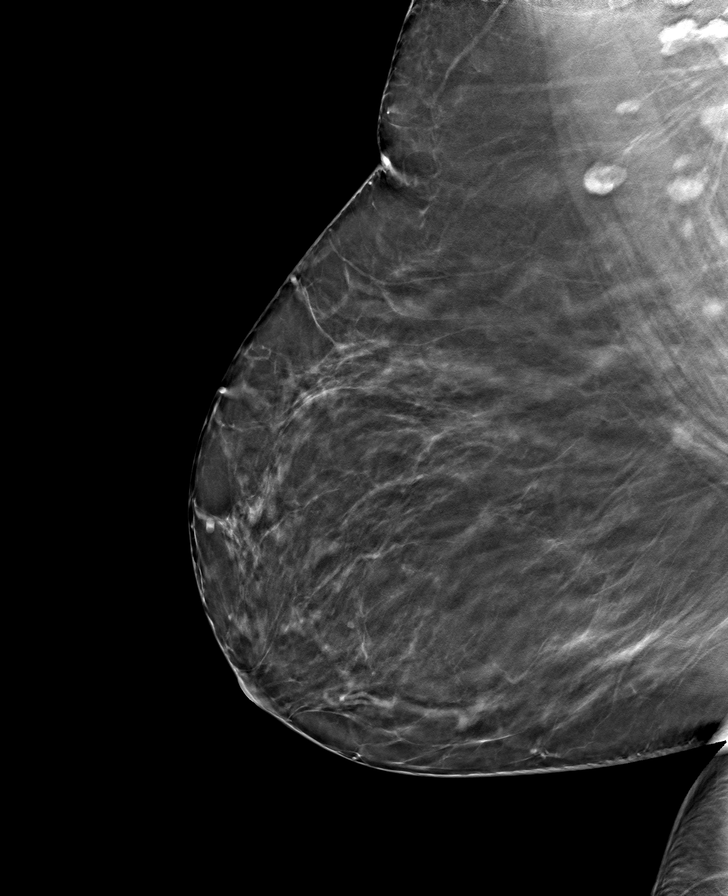

[8 of 24 positions shown; findings below may reference images not displayed]

ACR Breast Density Category b: There are scattered areas of
fibroglandular density.
FINDINGS: In the right breast, a possible asymmetry warrants further
evaluation. In the left breast, no findings suspicious for
malignancy.
IMPRESSION: Further evaluation is suggested for possible asymmetry in the right
breast.

RECOMMENDATION:
Diagnostic mammogram and possibly ultrasound of the right breast.
(Code:BU-C-IIK)

The patient will be contacted regarding the findings, and additional
imaging will be scheduled.

BI-RADS CATEGORY  0: Incomplete. Need additional imaging evaluation
and/or prior mammograms for comparison.

## 2022-05-20 ENCOUNTER — Telehealth: Payer: Self-pay | Admitting: Family Medicine

## 2022-05-20 NOTE — Telephone Encounter (Signed)
Caller states she sent over surgical clearance form via fax on 2/28 and 3/8. States she hasn't heard anything back and patient is set to come in on 3/18. Have you received this?

## 2022-05-21 NOTE — Telephone Encounter (Signed)
Patient need OV before signing form

## 2022-05-24 ENCOUNTER — Ambulatory Visit (INDEPENDENT_AMBULATORY_CARE_PROVIDER_SITE_OTHER): Payer: Medicare Other | Admitting: Family Medicine

## 2022-05-24 ENCOUNTER — Encounter: Payer: Self-pay | Admitting: Family Medicine

## 2022-05-24 VITALS — BP 150/84 | HR 76 | Temp 97.8°F | Ht 69.0 in | Wt 212.2 lb

## 2022-05-24 DIAGNOSIS — I152 Hypertension secondary to endocrine disorders: Secondary | ICD-10-CM | POA: Diagnosis not present

## 2022-05-24 DIAGNOSIS — E1165 Type 2 diabetes mellitus with hyperglycemia: Secondary | ICD-10-CM

## 2022-05-24 DIAGNOSIS — E1159 Type 2 diabetes mellitus with other circulatory complications: Secondary | ICD-10-CM

## 2022-05-24 NOTE — Assessment & Plan Note (Signed)
Not currently on any medications.  She is coming back for CPE in a month.  Check A1c at that time.

## 2022-05-24 NOTE — Patient Instructions (Signed)
It was very nice to see you today!  Your blood pressure looks much better today.  Please continue to monitor at home.  We will fax the clearance form to your dentist office.  I will see back next month for your physical.  Come back sooner if needed.  Take care, Dr Jerline Pain  PLEASE NOTE:  If you had any lab tests, please let us know if you have not heard back within a few days. You may see your results on mychart before we have a chance to review them but we will give you a call once they are reviewed by [place].   If we ordered any referrals today, please let us know if you have not heard from their office within the next week.   If you had any urgent prescriptions sent in today, please check with the pharmacy within an hour of our visit to make sure the prescription was transmitted appropriately.   Please try these tips to maintain a healthy lifestyle:  Eat at least 3 REAL meals and 1-2 snacks per day.  Aim for no more than 5 hours between eating.  If you eat breakfast, please do so within one hour of getting up.   Each meal should contain half fruits/vegetables, one quarter protein, and one quarter carbs (no bigger than a computer mouse)  Cut down on sweet beverages. This includes juice, soda, and sweet tea.   Drink at least 1 glass of water with each meal and aim for at least 8 glasses per day  Exercise at least 150 minutes every week.

## 2022-05-24 NOTE — Telephone Encounter (Signed)
Patient has OV today, form faxed to [phone]

## 2022-05-24 NOTE — Progress Notes (Signed)
   Shelly Yoder is a 77 y.o. female who presents today for an office visit.  Assessment/Plan:  Chronic Problems Addressed Today: Hypertension associated with diabetes ([place]) Much better controlled today on amlodipine 5 mg daily.  Home readings have all been at goal.  She is at 150/84 today.  She will continue to monitor at home and let us know if persistently elevated.  She will come back in a month for CPE and we can recheck at that time.  Type 2 diabetes mellitus with hyperglycemia, without long-term current use of insulin ([facility]) Not currently on any medications.  She is coming back for CPE in a month.  Check A1c at that time.     Subjective:  HPI:  Patient here today for follow-up.  We last saw her a couple of weeks ago for elevated blood pressure readings.  At that time blood pressure was elevated to 178/79.  We have prescribed amlodipine 5 mg daily.  She has been doing well with this. Home readings have been in the 140s /80s.  She has had occasional very slight dizziness that seems to be tolerable.  She thinks this is probably due to a medication side effect.  No reported leg swelling.  She needs a clearance for her to get dental work performed.  Last time that she was at the dentist office to have crowns placed blood pressure readings were in the 190s to 200s and she needs her clearance before she can proceed with this.       Objective:  Physical Exam: BP (!) 150/84   Pulse 76   Temp 97.8 F (36.6 C) (Temporal)   Ht 5\' 9"  (1.753 m)   Wt 212 lb 3.2 oz (96.3 kg)   LMP  (LMP Unknown)   SpO2 99%   BMI 31.34 kg/m   Gen: No acute distress, resting comfortably CV: Regular rate and rhythm with no murmurs appreciated Pulm: Normal work of breathing, clear to auscultation bilaterally with no crackles, wheezes, or rhonchi Neuro: Grossly normal, moves all extremities Psych: Normal affect and thought content      Shelly Yoder M. Jerline Pain, MD 05/24/2022 11:36 AM

## 2022-05-24 NOTE — Assessment & Plan Note (Addendum)
Much better controlled today on amlodipine 5 mg daily.  Home readings have all been at goal.  She is at 150/84 today.  She will continue to monitor at home and let us know if persistently elevated.  She will come back in a month for CPE and we can recheck at that time.

## 2022-06-21 ENCOUNTER — Telehealth: Payer: Self-pay | Admitting: Family Medicine

## 2022-06-21 ENCOUNTER — Encounter: Payer: Medicare Other | Admitting: Family Medicine

## 2022-06-21 NOTE — Telephone Encounter (Signed)
Contacted [name] to schedule their annual wellness visit. Appointment made for 06/29/2022.  Gabriel Cirri [facility] TE[facility] Direct Dial [phone]

## 2022-06-29 ENCOUNTER — Ambulatory Visit (INDEPENDENT_AMBULATORY_CARE_PROVIDER_SITE_OTHER): Payer: Medicare Other

## 2022-06-29 VITALS — Wt 212.0 lb

## 2022-06-29 DIAGNOSIS — Z0000 Encounter for general adult medical examination without abnormal findings: Secondary | ICD-10-CM

## 2022-06-29 NOTE — Progress Notes (Signed)
I connected with  Shelly Yoder on 06/29/22 by a audio enabled telemedicine application and verified that I am speaking with the correct person using two identifiers.  Patient Location: Home  Provider Location: Office/Clinic  I discussed the limitations of evaluation and management by telemedicine. The patient expressed understanding and agreed to proceed.   Subjective:   Shelly Yoder is a 77 y.o. female who presents for Medicare Annual (Subsequent) preventive examination.  Review of Systems     Cardiac Risk Factors include: advanced age (>18men, >47 women);diabetes mellitus;obesity (BMI >30kg/m2);dyslipidemia;hypertension     Objective:    Today's Vitals   06/29/22 0904  Weight: 212 lb (96.2 kg)   Body mass index is 31.31 kg/m.     06/29/2022    9:10 AM 02/16/2021    8:15 AM 02/15/2020    8:21 AM 01/18/2019    2:20 PM 06/12/2015   10:32 AM  Advanced Directives  Does Patient Have a Medical Advance Directive? Yes Yes Yes Yes Yes  Type of Estate agent of [place];Living will Healthcare Power of [employer] of [place];Living will Living will;Healthcare Power of [employer] Power of Attorney  Does patient want to make changes to medical advance directive? No - Patient declined   No - Patient declined   Copy of Healthcare Power of Attorney in Chart? Yes - validated most recent copy scanned in chart (See row information) No - copy requested No - copy requested No - copy requested     Current Medications (verified) Outpatient Encounter Medications as of 06/29/2022  Medication Sig   amLODipine (NORVASC) 5 MG tablet Take 1 tablet (5 mg total) by mouth daily.   CALCIUM PO Take 1 tablet by mouth daily at 6 (six) AM.   Cyanocobalamin (VITAMIN B 12 PO) Take 1 tablet by mouth daily at 6 (six) AM.   glucose blood (ONETOUCH VERIO) test strip USE TO CHECK BLOOD SUGAR DAILY AND AS NEEDED   Lancets (ONETOUCH DELICA PLUS LANCET33G) MISC USE TO CHECK  BLOOD SUGAR DAILY AND AS NEEDED   Multiple Vitamin (MULTI-VITAMINS) TABS Take 1 tablet by mouth daily at 6 (six) AM.   VITAMIN D, CHOLECALCIFEROL, PO Take 1 tablet by mouth daily at 6 (six) AM. daily   No facility-administered encounter medications on file as of 06/29/2022.    Allergies (verified) Atorvastatin and Hydrocodone-acetaminophen   History: Past Medical History:  Diagnosis Date   Anemia    Anxiety    situational - no meds   Arthritis    possibly    Bilateral hearing loss 03/29/2018   no hearing aids   Cataracts, bilateral    forming both eyes - just watching   Colon polyps    Depression    no meds   Diabetes mellitus without complication 2014    Diet controlled  no meds   Heart murmur    no problems   Hyperlipidemia    Hyperlipidemia associated with type 2 diabetes mellitus 03/29/2018   Hypertension    no meds, working on diet.weight loss   Hypertension associated with diabetes 03/29/2018   Lipoma    Lipoma of torso 03/29/2018   Refusal of blood transfusions as patient is Jehovah's Witness 12/04/2014   Seasonal allergies    Type 2 diabetes mellitus with hyperglycemia, without long-term current use of insulin 03/29/2018   Vitamin D deficiency 03/29/2018   Past Surgical History:  Procedure Laterality Date   ABDOMINAL HYSTERECTOMY  1996   partial  BREAST BIOPSY Right    COLONOSCOPY  06/12/2015   COLONOSCOPY  2021   MS-MAC-sutab-(good)-10 polyps-TA/HPP/tics   LIPOMA EXCISION     upper back   POLYPECTOMY  2021   HPP/TA   Family History  Problem Relation Age of Onset   Diabetes Mother    Heart attack Mother    Hyperlipidemia Mother    Heart disease Mother 50   Heart block Mother    Kidney disease Mother    Alcohol abuse Father    Diabetes Father    Heart disease Father    Kidney disease Father    Breast cancer Sister    Diabetes Brother    Hypertension Brother    Kidney disease Brother    Colon cancer Neg Hx    Esophageal cancer Neg Hx     Stomach cancer Neg Hx    Rectal cancer Neg Hx    Colon polyps Neg Hx    Social History   Socioeconomic History   Marital status: Married    Spouse name: Not on file   Number of children: 3   Years of education: Not on file   Highest education level: Not on file  Occupational History   Occupation: Retired    Comment: Marketing executive   Tobacco Use   Smoking status: Former    Types: Cigarettes    Quit date: 06/15/1975    Years since quitting: 47.0   Smokeless tobacco: Never  Vaping Use   Vaping Use: Never used  Substance and Sexual Activity   Alcohol use: Not Currently    Alcohol/week: 0.0 - 1.0 standard drinks of alcohol    Comment: occasionally Wine/Beer   Drug use: No   Sexual activity: Not Currently    Birth control/protection: Surgical    Comment: Hysterectomy  Other Topics Concern   Not on file  Social History Narrative   Not on file   Social Determinants of Health   Financial Resource Strain: Low Risk  (06/29/2022)   Overall Financial Resource Strain (CARDIA)    Difficulty of Paying Living Expenses: Not hard at all  Food Insecurity: No Food Insecurity (06/29/2022)   Hunger Vital Sign    Worried About Running Out of Food in the Last Year: Never true    Ran Out of Food in the Last Year: Never true  Transportation Needs: No Transportation Needs (06/29/2022)   PRAPARE - Administrator, Civil Service (Medical): No    Lack of Transportation (Non-Medical): No  Physical Activity: Sufficiently Active (06/29/2022)   Exercise Vital Sign    Days of Exercise per Week: 3 days    Minutes of Exercise per Session: 60 min  Stress: No Stress Concern Present (06/29/2022)   [employer] of Occupational Health - Occupational Stress Questionnaire    Feeling of Stress : Not at all  Social Connections: Socially Integrated (06/29/2022)   Social Connection and Isolation Panel [NHANES]    Frequency of Communication with Friends and Family: More than three times a week     Frequency of Social Gatherings with Friends and Family: More than three times a week    Attends Religious Services: More than 4 times per year    Active Member of [employer] or Organizations: Yes    Attends Banker Meetings: 1 to 4 times per year    Marital Status: Married    Tobacco Counseling Counseling given: Not Answered   Clinical Intake:  Pre-visit preparation completed: Yes  Pain : No/denies pain  BMI - recorded: 31.31 Nutritional Status: BMI > 30  Obese Nutritional Risks: None Diabetes: Yes CBG done?: No Did pt. bring in CBG monitor from home?: No  How often do you need to have someone help you when you read instructions, pamphlets, or other written materials from your doctor or pharmacy?: 1 - Never  Diabetic?Nutrition Risk Assessment:  Has the patient had any N/V/D within the last 2 months?  No  Does the patient have any non-healing wounds?  No  Has the patient had any unintentional weight loss or weight gain?  No   Diabetes:  Is the patient diabetic?  Yes  If diabetic, was a CBG obtained today?  No  Did the patient bring in their glucometer from home?  No  How often do you monitor your CBG's? Weekly .   Financial Strains and Diabetes Management:  Are you having any financial strains with the device, your supplies or your medication? No .  Does the patient want to be seen by Chronic Care Management for management of their diabetes?  No  Would the patient like to be referred to a Nutritionist or for Diabetic Management?  No   Diabetic Exams:  Diabetic Eye Exam: Completed 07/27/21 Diabetic Foot Exam: Completed 05/04/22   Interpreter Needed?: No  Information entered by :: Lanier Ensign, LPN   Activities of Daily Living    06/29/2022    9:11 AM  In your present state of health, do you have any difficulty performing the following activities:  Hearing? 1  Comment tinnitus  Vision? 0  Difficulty concentrating or making decisions? 0   Walking or climbing stairs? 1  Comment mindful of rail  Dressing or bathing? 0  Doing errands, shopping? 0  Preparing Food and eating ? N  Using the Toilet? N  In the past six months, have you accidently leaked urine? N  Do you have problems with loss of bowel control? N  Managing your Medications? N  Managing your Finances? N  Housekeeping or managing your Housekeeping? N    Patient Care Team: Ardith Dark, MD as PCP - General (Family Medicine) Meryl Dare, MD as Consulting Physician (Gastroenterology) Newman Pies, MD as Consulting Physician (Otolaryngology) [facility] as Consulting Physician Nita Sells, MD as Consulting Physician (Dermatology)  Indicate any recent Medical Services you may have received from other than [facility] providers in the past year (date may be approximate).     Assessment:   This is a routine wellness examination for [facility].  Hearing/Vision screen Hearing Screening - Comments:: Pt stated slight hearing loss due to tinnitus  Vision Screening - Comments:: Pt follows up with Dr Dione Booze for annual eye exams   Dietary issues and exercise activities discussed: Current Exercise Habits: Home exercise routine, Type of exercise: walking, Time (Minutes): 60, Frequency (Times/Week): 3, Weekly Exercise (Minutes/Week): 180   Goals Addressed             This Visit's Progress    Patient Stated       Get weight down and more exercise        Depression Screen    06/29/2022    9:07 AM 05/24/2022   11:09 AM 05/10/2022    9:29 AM 02/15/2022   11:54 AM 09/29/2021   10:19 AM 02/16/2021    8:13 AM 02/12/2021   10:52 AM  PHQ 2/9 Scores  PHQ - 2 Score 0 0 0 0 0 0 0    Fall Risk  06/29/2022    9:11 AM 05/24/2022   11:09 AM 05/10/2022    9:29 AM 02/15/2022   11:54 AM 09/29/2021   10:20 AM  Fall Risk   Falls in the past year? 0 0 0 0 0  Number falls in past yr: 0 0 0 0 0  Injury with Fall? 0 0 0 0 0  Risk for fall due to : Impaired vision  No Fall Risks No Fall Risks Other (Comment) No Fall Risks  Follow up Falls prevention discussed        FALL RISK PREVENTION PERTAINING TO THE HOME:  Any stairs in or around the home? No  If so, are there any without handrails? No  Home free of loose throw rugs in walkways, pet beds, electrical cords, etc? Yes  Adequate lighting in your home to reduce risk of falls? Yes   ASSISTIVE DEVICES UTILIZED TO PREVENT FALLS:  Life alert? No  Use of a cane, walker or w/c? No  Grab bars in the bathroom? No  Shower chair or bench in shower? No  Elevated toilet seat or a handicapped toilet? No   TIMED UP AND GO:  Was the test performed? No .  Cognitive Function:        06/29/2022    9:12 AM 02/16/2021    8:18 AM 02/15/2020    8:33 AM  6CIT Screen  What Year? 0 points 0 points 0 points  What month? 0 points 0 points 0 points  What time? 0 points 0 points   Count back from 20 0 points 0 points 0 points  Months in reverse 0 points 0 points 0 points  Repeat phrase 0 points 0 points 0 points  Total Score 0 points 0 points     Immunizations Immunization History  Administered Date(s) Administered   COVID-19, mRNA, vaccine(Comirnaty)12 years and older 12/28/2021   PFIZER Comirnaty(Gray Top)Covid-19 Tri-Sucrose Vaccine 07/28/2020   PFIZER(Purple Top)SARS-COV-2 Vaccination 04/12/2019, 05/07/2019, 11/13/2019   Pfizer Covid-19 Vaccine Bivalent Booster 51yrs & up 11/26/2020   Zoster Recombinat (Shingrix) 08/28/2021, 01/19/2022      Flu Vaccine status: Declined, Education has been provided regarding the importance of this vaccine but patient still declined. Advised may receive this vaccine at local pharmacy or Health Dept. Aware to provide a copy of the vaccination record if obtained from local pharmacy or Health Dept. Verbalized acceptance and understanding.  Pneumococcal vaccine status: Due, Education has been provided regarding the importance of this vaccine. Advised may receive this  vaccine at local pharmacy or Health Dept. Aware to provide a copy of the vaccination record if obtained from local pharmacy or Health Dept. Verbalized acceptance and understanding.  Covid-19 vaccine status: Completed vaccines  Qualifies for Shingles Vaccine? Yes   Zostavax completed Yes   Shingrix Completed?: Yes  Screening Tests Health Maintenance  Topic Date Due   Diabetic kidney evaluation - Urine ACR  07/25/2021   HEMOGLOBIN A1C  04/01/2022   Pneumonia Vaccine 32+ Years old (1 of 1 - PCV) 02/16/2023 (Originally 05/03/2011)   OPHTHALMOLOGY EXAM  07/28/2022   Diabetic kidney evaluation - eGFR measurement  09/30/2022   INFLUENZA VACCINE  10/07/2022   FOOT EXAM  05/05/2023   Medicare Annual Wellness (AWV)  06/29/2023   COLONOSCOPY (Pts 45-92yrs Insurance coverage will need to be confirmed)  08/24/2024   DEXA SCAN  Completed   COVID-19 Vaccine  Completed   Hepatitis C Screening  Completed   Zoster Vaccines- Shingrix  Completed   HPV VACCINES  Aged Out   DTaP/Tdap/Td  Discontinued    Health Maintenance  Health Maintenance Due  Topic Date Due   Diabetic kidney evaluation - Urine ACR  07/25/2021   HEMOGLOBIN A1C  04/01/2022    Colorectal cancer screening: Type of screening: Colonoscopy. Completed 08/24/21. Repeat every 3 years  Mammogram status: Completed 07/12/22. Repeat every year  Bone Density status: Completed 10/14/21. Results reflect: Bone density results: OSTEOPENIA. Repeat every 2 years.  Additional Screening:  Hepatitis C Screening:  Completed 01/23/16  Vision Screening: Recommended annual ophthalmology exams for early detection of glaucoma and other disorders of the eye. Is the patient up to date with their annual eye exam?  Yes  Who is the provider or what is the name of the office in which the patient attends annual eye exams? Dr Dione Booze  If pt is not established with a provider, would they like to be referred to a provider to establish care? No .   Dental  Screening: Recommended annual dental exams for proper oral hygiene  Community Resource Referral / Chronic Care Management: CRR required this visit?  No   CCM required this visit?  No      Plan:     I have personally reviewed and noted the following in the patient's chart:   Medical and social history Use of alcohol, tobacco or illicit drugs  Current medications and supplements including opioid prescriptions. Patient is not currently taking opioid prescriptions. Functional ability and status Nutritional status Physical activity Advanced directives List of other physicians Hospitalizations, surgeries, and ER visits in previous 12 months Vitals Screenings to include cognitive, depression, and falls Referrals and appointments  In addition, I have reviewed and discussed with patient certain preventive protocols, quality metrics, and best practice recommendations. A written personalized care plan for preventive services as well as general preventive health recommendations were provided to patient.     Marzella Schlein, LPN   1/61/0960   Nurse Notes: none

## 2022-06-29 NOTE — Patient Instructions (Signed)
Shelly Yoder , Thank you for taking time to come for your Medicare Wellness Visit. I appreciate your ongoing commitment to your health goals. Please review the following plan we discussed and let me know if I can assist you in the future.   These are the goals we discussed:  Goals      Patient Stated     Maintain current state of health and remain Covid free      Patient Stated     Manage diet better to get weight down      Patient Stated     More exercise and maintain diet         This is a list of the screening recommended for you and due dates:  Health Maintenance  Topic Date Due   Yearly kidney health urinalysis for diabetes  07/25/2021   Medicare Annual Wellness Visit  02/16/2022   Hemoglobin A1C  04/01/2022   Pneumonia Vaccine (1 of 1 - PCV) 02/16/2023*   Eye exam for diabetics  07/28/2022   Yearly kidney function blood test for diabetes  09/30/2022   Flu Shot  10/07/2022   Complete foot exam   05/05/2023   Colon Cancer Screening  08/24/2024   DEXA scan (bone density measurement)  Completed   COVID-19 Vaccine  Completed   Hepatitis C Screening: USPSTF Recommendation to screen - Ages 25-79 yo.  Completed   Zoster (Shingles) Vaccine  Completed   HPV Vaccine  Aged Out   DTaP/Tdap/Td vaccine  Discontinued  *Topic was postponed. The date shown is not the original due date.    Advanced directives: copies in chart   Conditions/risks identified: Get weight down and more exercise   Next appointment: Follow up in one year for your annual wellness visit    Preventive Care 65 Years and Older, Female Preventive care refers to lifestyle choices and visits with your health care provider that can promote health and wellness. What does preventive care include? A yearly physical exam. This is also called an annual well check. Dental exams once or twice a year. Routine eye exams. Ask your health care provider how often you should have your eyes checked. Personal lifestyle choices,  including: Daily care of your teeth and gums. Regular physical activity. Eating a healthy diet. Avoiding tobacco and drug use. Limiting alcohol use. Practicing safe sex. Taking low-dose aspirin every day. Taking vitamin and mineral supplements as recommended by your health care provider. What happens during an annual well check? The services and screenings done by your health care provider during your annual well check will depend on your age, overall health, lifestyle risk factors, and family history of disease. Counseling  Your health care provider may ask you questions about your: Alcohol use. Tobacco use. Drug use. Emotional well-being. Home and relationship well-being. Sexual activity. Eating habits. History of falls. Memory and ability to understand (cognition). Work and work Astronomer. Reproductive health. Screening  You may have the following tests or measurements: Height, weight, and BMI. Blood pressure. Lipid and cholesterol levels. These may be checked every 5 years, or more frequently if you are over 53 years old. Skin check. Lung cancer screening. You may have this screening every year starting at age 69 if you have a 30-pack-year history of smoking and currently smoke or have quit within the past 15 years. Fecal occult blood test (FOBT) of the stool. You may have this test every year starting at age 77. Flexible sigmoidoscopy or colonoscopy. You may have a sigmoidoscopy  every 5 years or a colonoscopy every 10 years starting at age 63. Hepatitis C blood test. Hepatitis B blood test. Sexually transmitted disease (STD) testing. Diabetes screening. This is done by checking your blood sugar (glucose) after you have not eaten for a while (fasting). You may have this done every 1-3 years. Bone density scan. This is done to screen for osteoporosis. You may have this done starting at age 84. Mammogram. This may be done every 1-2 years. Talk to your health care provider  about how often you should have regular mammograms. Talk with your health care provider about your test results, treatment options, and if necessary, the need for more tests. Vaccines  Your health care provider may recommend certain vaccines, such as: Influenza vaccine. This is recommended every year. Tetanus, diphtheria, and acellular pertussis (Tdap, Td) vaccine. You may need a Td booster every 10 years. Zoster vaccine. You may need this after age 65. Pneumococcal 13-valent conjugate (PCV13) vaccine. One dose is recommended after age 44. Pneumococcal polysaccharide (PPSV23) vaccine. One dose is recommended after age 50. Talk to your health care provider about which screenings and vaccines you need and how often you need them. This information is not intended to replace advice given to you by your health care provider. Make sure you discuss any questions you have with your health care provider. Document Released: 03/21/2015 Document Revised: 11/12/2015 Document Reviewed: 12/24/2014 Elsevier Interactive Patient Education  2017 [employer].  Fall Prevention in the Home Falls can cause injuries. They can happen to people of all ages. There are many things you can do to make your home safe and to help prevent falls. What can I do on the outside of my home? Regularly fix the edges of walkways and driveways and fix any cracks. Remove anything that might make you trip as you walk through a door, such as a raised step or threshold. Trim any bushes or trees on the path to your home. Use bright outdoor lighting. Clear any walking paths of anything that might make someone trip, such as rocks or tools. Regularly check to see if handrails are loose or broken. Make sure that both sides of any steps have handrails. Any raised decks and porches should have guardrails on the edges. Have any leaves, snow, or ice cleared regularly. Use sand or salt on walking paths during winter. Clean up any spills in  your garage right away. This includes oil or grease spills. What can I do in the bathroom? Use night lights. Install grab bars by the toilet and in the tub and shower. Do not use towel bars as grab bars. Use non-skid mats or decals in the tub or shower. If you need to sit down in the shower, use a plastic, non-slip stool. Keep the floor dry. Clean up any water that spills on the floor as soon as it happens. Remove soap buildup in the tub or shower regularly. Attach bath mats securely with double-sided non-slip rug tape. Do not have throw rugs and other things on the floor that can make you trip. What can I do in the bedroom? Use night lights. Make sure that you have a light by your bed that is easy to reach. Do not use any sheets or blankets that are too big for your bed. They should not hang down onto the floor. Have a firm chair that has side arms. You can use this for support while you get dressed. Do not have throw rugs and other things  on the floor that can make you trip. What can I do in the kitchen? Clean up any spills right away. Avoid walking on wet floors. Keep items that you use a lot in easy-to-reach places. If you need to reach something above you, use a strong step stool that has a grab bar. Keep electrical cords out of the way. Do not use floor polish or wax that makes floors slippery. If you must use wax, use non-skid floor wax. Do not have throw rugs and other things on the floor that can make you trip. What can I do with my stairs? Do not leave any items on the stairs. Make sure that there are handrails on both sides of the stairs and use them. Fix handrails that are broken or loose. Make sure that handrails are as long as the stairways. Check any carpeting to make sure that it is firmly attached to the stairs. Fix any carpet that is loose or worn. Avoid having throw rugs at the top or bottom of the stairs. If you do have throw rugs, attach them to the floor with carpet  tape. Make sure that you have a light switch at the top of the stairs and the bottom of the stairs. If you do not have them, ask someone to add them for you. What else can I do to help prevent falls? Wear shoes that: Do not have high heels. Have rubber bottoms. Are comfortable and fit you well. Are closed at the toe. Do not wear sandals. If you use a stepladder: Make sure that it is fully opened. Do not climb a closed stepladder. Make sure that both sides of the stepladder are locked into place. Ask someone to hold it for you, if possible. Clearly mark and make sure that you can see: Any grab bars or handrails. First and last steps. Where the edge of each step is. Use tools that help you move around (mobility aids) if they are needed. These include: Canes. Walkers. Scooters. Crutches. Turn on the lights when you go into a dark area. Replace any light bulbs as soon as they burn out. Set up your furniture so you have a clear path. Avoid moving your furniture around. If any of your floors are uneven, fix them. If there are any pets around you, be aware of where they are. Review your medicines with your doctor. Some medicines can make you feel dizzy. This can increase your chance of falling. Ask your doctor what other things that you can do to help prevent falls. This information is not intended to replace advice given to you by your health care provider. Make sure you discuss any questions you have with your health care provider. Document Released: 12/19/2008 Document Revised: 07/31/2015 Document Reviewed: 03/29/2014 Elsevier Interactive Patient Education  2017 [employer].

## 2022-07-05 ENCOUNTER — Ambulatory Visit (INDEPENDENT_AMBULATORY_CARE_PROVIDER_SITE_OTHER): Payer: Medicare Other | Admitting: Family Medicine

## 2022-07-05 ENCOUNTER — Encounter: Payer: Self-pay | Admitting: Family Medicine

## 2022-07-05 VITALS — BP 132/86 | HR 72 | Temp 98.0°F | Resp 16 | Ht 69.0 in | Wt 212.8 lb

## 2022-07-05 DIAGNOSIS — E559 Vitamin D deficiency, unspecified: Secondary | ICD-10-CM | POA: Diagnosis not present

## 2022-07-05 DIAGNOSIS — E785 Hyperlipidemia, unspecified: Secondary | ICD-10-CM | POA: Diagnosis not present

## 2022-07-05 DIAGNOSIS — I152 Hypertension secondary to endocrine disorders: Secondary | ICD-10-CM | POA: Diagnosis not present

## 2022-07-05 DIAGNOSIS — Z0001 Encounter for general adult medical examination with abnormal findings: Secondary | ICD-10-CM | POA: Diagnosis not present

## 2022-07-05 DIAGNOSIS — E1165 Type 2 diabetes mellitus with hyperglycemia: Secondary | ICD-10-CM | POA: Diagnosis not present

## 2022-07-05 DIAGNOSIS — E1159 Type 2 diabetes mellitus with other circulatory complications: Secondary | ICD-10-CM | POA: Diagnosis not present

## 2022-07-05 DIAGNOSIS — E1169 Type 2 diabetes mellitus with other specified complication: Secondary | ICD-10-CM

## 2022-07-05 LAB — CBC
HCT: 37.6 % (ref 36.0–46.0)
Hemoglobin: 12.3 g/dL (ref 12.0–15.0)
MCHC: 32.8 g/dL (ref 30.0–36.0)
MCV: 87.3 fl (ref 78.0–100.0)
Platelets: 172 10*3/uL (ref 150.0–400.0)
RBC: 4.31 Mil/uL (ref 3.87–5.11)
RDW: 13.9 % (ref 11.5–15.5)
WBC: 5.4 10*3/uL (ref 4.0–10.5)

## 2022-07-05 LAB — COMPREHENSIVE METABOLIC PANEL
ALT: 12 U/L (ref 0–35)
AST: 21 U/L (ref 0–37)
Albumin: 3.9 g/dL (ref 3.5–5.2)
Alkaline Phosphatase: 63 U/L (ref 39–117)
BUN: 14 mg/dL (ref 6–23)
CO2: 29 mEq/L (ref 19–32)
Calcium: 9.4 mg/dL (ref 8.4–10.5)
Chloride: 104 mEq/L (ref 96–112)
Creatinine, Ser: 0.67 mg/dL (ref 0.40–1.20)
GFR: 85.05 mL/min (ref >60.00)
Glucose, Bld: 105 mg/dL — ABNORMAL HIGH (ref 70–99)
Potassium: 4.2 mEq/L (ref 3.5–5.1)
Sodium: 141 mEq/L (ref 135–145)
Total Bilirubin: 0.4 mg/dL (ref 0.2–1.2)
Total Protein: 7.1 g/dL (ref 6.0–8.3)

## 2022-07-05 LAB — LIPID PANEL
Cholesterol: 264 mg/dL — ABNORMAL HIGH (ref 0–200)
HDL: 64.4 mg/dL (ref >39.00)
LDL Cholesterol: 179 mg/dL — ABNORMAL HIGH (ref 0–99)
NonHDL: 199.41
Total CHOL/HDL Ratio: 4
Triglycerides: 102 mg/dL (ref 0.0–149.0)
VLDL: 20.4 mg/dL (ref 0.0–40.0)

## 2022-07-05 LAB — MICROALBUMIN / CREATININE URINE RATIO
Creatinine,U: 131.6 mg/dL
Microalb Creat Ratio: 1.2 mg/g (ref 0.0–30.0)
Microalb, Ur: 1.6 mg/dL (ref 0.0–1.9)

## 2022-07-05 LAB — TSH: TSH: 0.75 u[IU]/mL (ref 0.35–5.50)

## 2022-07-05 LAB — VITAMIN D 25 HYDROXY (VIT D DEFICIENCY, FRACTURES): VITD: 30.11 ng/mL (ref 30.00–100.00)

## 2022-07-05 LAB — HEMOGLOBIN A1C: Hgb A1c MFr Bld: 6.9 % — ABNORMAL HIGH (ref 4.6–6.5)

## 2022-07-05 MED ORDER — AMLODIPINE BESYLATE 10 MG PO TABS: 10.0000 mg | ORAL_TABLET | Freq: Every day | ORAL | 3 refills | Status: AC

## 2022-07-05 NOTE — Assessment & Plan Note (Signed)
Check lipids.  Continue lifestyle modifications. 

## 2022-07-05 NOTE — Assessment & Plan Note (Signed)
Check vitamin D. 

## 2022-07-05 NOTE — Assessment & Plan Note (Signed)
Check A1c.  Currently diet controlled.

## 2022-07-05 NOTE — Progress Notes (Signed)
Chief Complaint:  Shelly Yoder is a 77 y.o. female who presents today for her annual comprehensive physical exam.    Assessment/Plan:  Chronic Problems Addressed Today: Hypertension associated with diabetes (HCC) Initially elevated however at goal on recheck.  Home readings have been borderline elevated.  Will increase amlodipine to 10 mg daily.  She will monitor at home and let us know if persistently elevated.  We discussed potential side effects.  Type 2 diabetes mellitus with hyperglycemia, without long-term current use of insulin (HCC) Check A1c.  Currently diet controlled.  Hyperlipidemia associated with type 2 diabetes mellitus (HCC) Check lipids.  Continue lifestyle modifications.  Vitamin D deficiency Check vitamin D.  Preventative Healthcare: Check labs.  Discussed pneumonia vaccine however she deferred for today.  Up-to-date on cancer screening.  Patient Counseling(The following topics were reviewed and/or handout was given):  -Nutrition: Stressed importance of moderation in sodium/caffeine intake, saturated fat and cholesterol, caloric balance, sufficient intake of fresh fruits, vegetables, and fiber.  -Stressed the importance of regular exercise.   -Substance Abuse: Discussed cessation/primary prevention of tobacco, alcohol, or other drug use; driving or other dangerous activities under the influence; availability of treatment for abuse.   -Injury prevention: Discussed safety belts, safety helmets, smoke detector, smoking near bedding or upholstery.   -Sexuality: Discussed sexually transmitted diseases, partner selection, use of condoms, avoidance of unintended pregnancy and contraceptive alternatives.   -Dental health: Discussed importance of regular tooth brushing, flossing, and dental visits.  -Health maintenance and immunizations reviewed. Please refer to Health maintenance section.  Return to care in 1 year for next preventative visit.     Subjective:   HPI:  She has no acute complaints today.   Lifestyle Diet: Balanced. Plenty of fruits and vegetables.  Exercise: Walks routinely.      07/05/2022    9:22 AM  Depression screen PHQ 2/9  Decreased Interest 0  Down, Depressed, Hopeless 0  PHQ - 2 Score 0  Altered sleeping 0  Tired, decreased energy 0  Change in appetite 0  Feeling bad or failure about yourself  0  Trouble concentrating 0  Moving slowly or fidgety/restless 0  Suicidal thoughts 0  PHQ-9 Score 0  Difficult doing work/chores Not difficult at all    Health Maintenance Due  Topic Date Due   Diabetic kidney evaluation - Urine ACR  07/25/2021   HEMOGLOBIN A1C  04/01/2022     ROS: Per HPI, otherwise a complete review of systems was negative.   PMH:  The following were reviewed and entered/updated in epic: Past Medical History:  Diagnosis Date   Anemia    Anxiety    situational - no meds   Arthritis    possibly    Bilateral hearing loss 03/29/2018   no hearing aids   Cataracts, bilateral    forming both eyes - just watching   Colon polyps    Depression    no meds   Diabetes mellitus without complication (HCC) 2014    Diet controlled  no meds   Heart murmur    no problems   Hyperlipidemia    Hyperlipidemia associated with type 2 diabetes mellitus (HCC) 03/29/2018   Hypertension    no meds, working on diet.weight loss   Hypertension associated with diabetes (HCC) 03/29/2018   Lipoma    Lipoma of torso 03/29/2018   Refusal of blood transfusions as patient is Jehovah's Witness 12/04/2014   Seasonal allergies    Type 2 diabetes mellitus with hyperglycemia, without  long-term current use of insulin (HCC) 03/29/2018   Vitamin D deficiency 03/29/2018   Patient Active Problem List   Diagnosis Date Noted   Vitamin D deficiency 03/29/2018   Hyperlipidemia associated with type 2 diabetes mellitus (HCC) 03/29/2018   Hypertension associated with diabetes (HCC) 03/29/2018   Type 2 diabetes mellitus with  hyperglycemia, without long-term current use of insulin (HCC) 03/29/2018   Family history of breast cancer in first degree relative, sister 03/29/2018   Lipoma of torso 03/29/2018   Morbid obesity (HCC) 03/29/2018   ASCVD (arteriosclerotic cardiovascular disease) high risk 03/29/2018   Refusal of blood transfusions as patient is Jehovah's Witness 12/04/2014   Past Surgical History:  Procedure Laterality Date   ABDOMINAL HYSTERECTOMY  1996   partial    BREAST BIOPSY Right    COLONOSCOPY  06/12/2015   COLONOSCOPY  2021   MS-MAC-sutab-(good)-10 polyps-TA/HPP/tics   LIPOMA EXCISION     upper back   POLYPECTOMY  2021   HPP/TA    Family History  Problem Relation Age of Onset   Diabetes Mother    Heart attack Mother    Hyperlipidemia Mother    Heart disease Mother 68   Heart block Mother    Kidney disease Mother    Alcohol abuse Father    Diabetes Father    Heart disease Father    Kidney disease Father    Breast cancer Sister    Diabetes Brother    Hypertension Brother    Kidney disease Brother    Colon cancer Neg Hx    Esophageal cancer Neg Hx    Stomach cancer Neg Hx    Rectal cancer Neg Hx    Colon polyps Neg Hx     Medications- reviewed and updated Current Outpatient Medications  Medication Sig Dispense Refill   CALCIUM PO Take 1 tablet by mouth daily at 6 (six) AM.     Cyanocobalamin (VITAMIN B 12 PO) Take 1 tablet by mouth daily at 6 (six) AM.     glucose blood (ONETOUCH VERIO) test strip USE TO CHECK BLOOD SUGAR DAILY AND AS NEEDED 100 strip 4   Lancets (ONETOUCH DELICA PLUS LANCET33G) MISC USE TO CHECK BLOOD SUGAR DAILY AND AS NEEDED 100 each 4   Multiple Vitamin (MULTI-VITAMINS) TABS Take 1 tablet by mouth daily at 6 (six) AM.     VITAMIN D, CHOLECALCIFEROL, PO Take 1 tablet by mouth daily at 6 (six) AM. daily     amLODipine (NORVASC) 10 MG tablet Take 1 tablet (10 mg total) by mouth daily. 90 tablet 3   No current facility-administered medications for this  visit.    Allergies-reviewed and updated Allergies  Allergen Reactions   Atorvastatin Other (See Comments)    Muscle aches   Hydrocodone-Acetaminophen Other (See Comments)    Social History   Socioeconomic History   Marital status: Married    Spouse name: Not on file   Number of children: 3   Years of education: Not on file   Highest education level: Not on file  Occupational History   Occupation: Retired    Comment: Marketing executive   Tobacco Use   Smoking status: Former    Types: Cigarettes    Quit date: 06/15/1975    Years since quitting: 47.0   Smokeless tobacco: Never  Vaping Use   Vaping Use: Never used  Substance and Sexual Activity   Alcohol use: Not Currently    Alcohol/week: 0.0 - 1.0 standard drinks of alcohol  Comment: occasionally Wine/Beer   Drug use: No   Sexual activity: Not Currently    Birth control/protection: Surgical    Comment: Hysterectomy  Other Topics Concern   Not on file  Social History Narrative   Not on file   Social Determinants of Health   Financial Resource Strain: Low Risk  (06/29/2022)   Overall Financial Resource Strain (CARDIA)    Difficulty of Paying Living Expenses: Not hard at all  Food Insecurity: No Food Insecurity (06/29/2022)   Hunger Vital Sign    Worried About Running Out of Food in the Last Year: Never true    Ran Out of Food in the Last Year: Never true  Transportation Needs: No Transportation Needs (06/29/2022)   PRAPARE - Administrator, Civil Service (Medical): No    Lack of Transportation (Non-Medical): No  Physical Activity: Sufficiently Active (06/29/2022)   Exercise Vital Sign    Days of Exercise per Week: 3 days    Minutes of Exercise per Session: 60 min  Stress: No Stress Concern Present (06/29/2022)   [employer] of Occupational Health - Occupational Stress Questionnaire    Feeling of Stress : Not at all  Social Connections: Socially Integrated (06/29/2022)   Social Connection and  Isolation Panel [NHANES]    Frequency of Communication with Friends and Family: More than three times a week    Frequency of Social Gatherings with Friends and Family: More than three times a week    Attends Religious Services: More than 4 times per year    Active Member of [employer] or Organizations: Yes    Attends Banker Meetings: 1 to 4 times per year    Marital Status: Married        Objective:  Physical Exam: BP 132/86   Pulse 72   Temp 98 F (36.7 C) (Temporal)   Resp 16   Ht 5\' 9"  (1.753 m)   Wt 212 lb 12.8 oz (96.5 kg)   LMP  (LMP Unknown)   SpO2 100%   BMI 31.43 kg/m   Body mass index is 31.43 kg/m. Wt Readings from Last 3 Encounters:  07/05/22 212 lb 12.8 oz (96.5 kg)  06/29/22 212 lb (96.2 kg)  05/24/22 212 lb 3.2 oz (96.3 kg)   Gen: NAD, resting comfortably HEENT: TMs normal bilaterally. OP clear. No thyromegaly noted.  CV: RRR with no murmurs appreciated Pulm: NWOB, CTAB with no crackles, wheezes, or rhonchi GI: Normal bowel sounds present. Soft, Nontender, Nondistended. MSK: no edema, cyanosis, or clubbing noted Skin: warm, dry Neuro: CN2-12 grossly intact. Strength 5/5 in upper and lower extremities. Reflexes symmetric and intact bilaterally.  Psych: Normal affect and thought content     Brailynn Breth M. Jimmey Ralph, MD 07/05/2022 10:13 AM

## 2022-07-05 NOTE — Assessment & Plan Note (Signed)
Initially elevated however at goal on recheck.  Home readings have been borderline elevated.  Will increase amlodipine to 10 mg daily.  She will monitor at home and let us know if persistently elevated.  We discussed potential side effects.

## 2022-07-05 NOTE — Patient Instructions (Addendum)
It was very nice to see you today!  Please increase your amlodipine to 10 mg daily.  Please keep an eye on your blood pressure and send [place] a message in a few weeks to let us know how it is looking.  Return in about 1 year (around 07/05/2023) for Annual Physical.   Take care, Dr Jimmey Ralph  PLEASE NOTE:  If you had any lab tests, please let us know if you have not heard back within a few days. You may see your results on mychart before we have a chance to review them but we will give you a call once they are reviewed by [place].   If we ordered any referrals today, please let us know if you have not heard from their office within the next week.   If you had any urgent prescriptions sent in today, please check with the pharmacy within an hour of our visit to make sure the prescription was transmitted appropriately.   Please try these tips to maintain a healthy lifestyle:  Eat at least 3 REAL meals and 1-2 snacks per day.  Aim for no more than 5 hours between eating.  If you eat breakfast, please do so within one hour of getting up.   Each meal should contain half fruits/vegetables, one quarter protein, and one quarter carbs (no bigger than a computer mouse)  Cut down on sweet beverages. This includes juice, soda, and sweet tea.   Drink at least 1 glass of water with each meal and aim for at least 8 glasses per day  Exercise at least 150 minutes every week.    Preventive Care 79 Years and Older, Female Preventive care refers to lifestyle choices and visits with your health care provider that can promote health and wellness. Preventive care visits are also called wellness exams. What can I expect for my preventive care visit? Counseling Your health care provider may ask you questions about your: Medical history, including: Past medical problems. Family medical history. Pregnancy and menstrual history. History of falls. Current health, including: Memory and ability to understand  (cognition). Emotional well-being. Home life and relationship well-being. Sexual activity and sexual health. Lifestyle, including: Alcohol, nicotine or tobacco, and drug use. Access to firearms. Diet, exercise, and sleep habits. Work and work Astronomer. Sunscreen use. Safety issues such as seatbelt and bike helmet use. Physical exam Your health care provider will check your: Height and weight. These may be used to calculate your BMI (body mass index). BMI is a measurement that tells if you are at a healthy weight. Waist circumference. This measures the distance around your waistline. This measurement also tells if you are at a healthy weight and may help predict your risk of certain diseases, such as type 2 diabetes and high blood pressure. Heart rate and blood pressure. Body temperature. Skin for abnormal spots. What immunizations do I need?  Vaccines are usually given at various ages, according to a schedule. Your health care provider will recommend vaccines for you based on your age, medical history, and lifestyle or other factors, such as travel or where you work. What tests do I need? Screening Your health care provider may recommend screening tests for certain conditions. This may include: Lipid and cholesterol levels. Hepatitis C test. Hepatitis B test. HIV (human immunodeficiency virus) test. STI (sexually transmitted infection) testing, if you are at risk. Lung cancer screening. Colorectal cancer screening. Diabetes screening. This is done by checking your blood sugar (glucose) after you have not eaten for  a while (fasting). Mammogram. Talk with your health care provider about how often you should have regular mammograms. BRCA-related cancer screening. This may be done if you have a family history of breast, ovarian, tubal, or peritoneal cancers. Bone density scan. This is done to screen for osteoporosis. Talk with your health care provider about your test results,  treatment options, and if necessary, the need for more tests. Follow these instructions at home: Eating and drinking  Eat a diet that includes fresh fruits and vegetables, whole grains, lean protein, and low-fat dairy products. Limit your intake of foods with high amounts of sugar, saturated fats, and salt. Take vitamin and mineral supplements as recommended by your health care provider. Do not drink alcohol if your health care provider tells you not to drink. If you drink alcohol: Limit how much you have to 0-1 drink a day. Know how much alcohol is in your drink. In the [place] one drink equals one 12 oz bottle of beer (355 mL), one 5 oz glass of wine (148 mL), or one 1 oz glass of hard liquor (44 mL). Lifestyle Brush your teeth every morning and night with fluoride toothpaste. Floss one time each day. Exercise for at least 30 minutes 5 or more days each week. Do not use any products that contain nicotine or tobacco. These products include cigarettes, chewing tobacco, and vaping devices, such as e-cigarettes. If you need help quitting, ask your health care provider. Do not use drugs. If you are sexually active, practice safe sex. Use a condom or other form of protection in order to prevent STIs. Take aspirin only as told by your health care provider. Make sure that you understand how much to take and what form to take. Work with your health care provider to find out whether it is safe and beneficial for you to take aspirin daily. Ask your health care provider if you need to take a cholesterol-lowering medicine (statin). Find healthy ways to manage stress, such as: Meditation, yoga, or listening to music. Journaling. Talking to a trusted person. Spending time with friends and family. Minimize exposure to UV radiation to reduce your risk of skin cancer. Safety Always wear your seat belt while driving or riding in a vehicle. Do not drive: If you have been drinking alcohol. Do not ride with  someone who has been drinking. When you are tired or distracted. While texting. If you have been using any mind-altering substances or drugs. Wear a helmet and other protective equipment during sports activities. If you have firearms in your house, make sure you follow all gun safety procedures. What's next? Visit your health care provider once a year for an annual wellness visit. Ask your health care provider how often you should have your eyes and teeth checked. Stay up to date on all vaccines. This information is not intended to replace advice given to you by your health care provider. Make sure you discuss any questions you have with your health care provider. Document Revised: 08/20/2020 Document Reviewed: 08/20/2020 Elsevier Patient Education  [place].

## 2022-07-06 NOTE — Progress Notes (Signed)
Her A1c is 6.9.  This is the same was a few months ago.  Do not need to start meds for her sugar but she should continue to work on diet and exercise and we can recheck this again in 6 months.  Her cholesterol is up a bit since last time we checked as well.  We have discussed cholesterol medications in the past.  Recommend starting Lipitor 40 mg daily to improve her numbers and lower risk of heart attack and stroke.  Please send it if she is agreeable.  Regardless she should continue to work on diet and exercise.  We can recheck everything else in a year.

## 2022-07-12 ENCOUNTER — Ambulatory Visit: Payer: Medicare Other

## 2022-07-14 ENCOUNTER — Other Ambulatory Visit: Payer: Self-pay | Admitting: *Deleted

## 2022-07-14 DIAGNOSIS — E1165 Type 2 diabetes mellitus with hyperglycemia: Secondary | ICD-10-CM

## 2022-07-14 DIAGNOSIS — E1169 Type 2 diabetes mellitus with other specified complication: Secondary | ICD-10-CM

## 2022-07-15 ENCOUNTER — Other Ambulatory Visit: Payer: Self-pay | Admitting: *Deleted

## 2022-07-15 MED ORDER — SIMVASTATIN 40 MG PO TABS: 40.0000 mg | ORAL_TABLET | Freq: Every day | ORAL | 1 refills | Status: DC

## 2022-07-15 NOTE — Progress Notes (Signed)
She has muscle aches - this is not a true allergy but we can try an alternative if she wishes. Please send in simvastatin 40 mg daily. We should recheck in 3-6 months.  Shelly Yoder. Jimmey Ralph, MD 07/15/2022 3:05 PM

## 2022-08-17 ENCOUNTER — Ambulatory Visit
Admission: RE | Admit: 2022-08-17 | Discharge: 2022-08-17 | Disposition: A | Discharge status: Home / Self Care | Payer: Medicare Other | Source: Ambulatory Visit | Attending: Family Medicine | Admitting: Family Medicine

## 2022-08-17 DIAGNOSIS — Z1231 Encounter for screening mammogram for malignant neoplasm of breast: Secondary | ICD-10-CM

## 2022-11-01 DIAGNOSIS — E119 Type 2 diabetes mellitus without complications: Secondary | ICD-10-CM | POA: Diagnosis not present

## 2022-11-01 DIAGNOSIS — H2513 Age-related nuclear cataract, bilateral: Secondary | ICD-10-CM | POA: Diagnosis not present

## 2022-11-01 DIAGNOSIS — H5213 Myopia, bilateral: Secondary | ICD-10-CM | POA: Diagnosis not present

## 2022-11-01 LAB — HM DIABETES EYE EXAM

## 2023-01-10 ENCOUNTER — Other Ambulatory Visit: Payer: Medicare Other

## 2023-01-10 ENCOUNTER — Ambulatory Visit: Payer: Medicare Other | Admitting: Family Medicine

## 2023-01-18 ENCOUNTER — Encounter: Payer: Self-pay | Admitting: Family Medicine

## 2023-01-18 ENCOUNTER — Ambulatory Visit (INDEPENDENT_AMBULATORY_CARE_PROVIDER_SITE_OTHER): Payer: Medicare Other | Admitting: Family Medicine

## 2023-01-18 VITALS — BP 158/70 | HR 77 | Temp 97.7°F | Wt 211.4 lb

## 2023-01-18 DIAGNOSIS — E1159 Type 2 diabetes mellitus with other circulatory complications: Secondary | ICD-10-CM | POA: Diagnosis not present

## 2023-01-18 DIAGNOSIS — I152 Hypertension secondary to endocrine disorders: Secondary | ICD-10-CM

## 2023-01-18 DIAGNOSIS — E785 Hyperlipidemia, unspecified: Secondary | ICD-10-CM | POA: Diagnosis not present

## 2023-01-18 DIAGNOSIS — E1169 Type 2 diabetes mellitus with other specified complication: Secondary | ICD-10-CM

## 2023-01-18 DIAGNOSIS — E1165 Type 2 diabetes mellitus with hyperglycemia: Secondary | ICD-10-CM

## 2023-01-18 LAB — LIPID PANEL
Cholesterol: 262 mg/dL — ABNORMAL HIGH (ref 0–200)
HDL: 62.8 mg/dL (ref >39.00)
LDL Cholesterol: 175 mg/dL — ABNORMAL HIGH (ref 0–99)
NonHDL: 199.26
Total CHOL/HDL Ratio: 4
Triglycerides: 119 mg/dL (ref 0.0–149.0)
VLDL: 23.8 mg/dL (ref 0.0–40.0)

## 2023-01-18 LAB — HEMOGLOBIN A1C: Hgb A1c MFr Bld: 6.8 % — ABNORMAL HIGH (ref 4.6–6.5)

## 2023-01-18 NOTE — Assessment & Plan Note (Signed)
Check lipids.  She is doing well simvastatin 40 mg daily.

## 2023-01-18 NOTE — Patient Instructions (Signed)
It was very nice to see you today!  We will recheck labs.  Please keep an eye on your blood pressure and let us know if it is persistently elevated.  Will see you back in 6 months.  Come back sooner if needed.  Please continue to work on diet and exercise.  Return in about 6 months (around 07/18/2023) for Annual Physical.   Take care, Dr Jimmey Ralph  PLEASE NOTE:  If you had any lab tests, please let us know if you have not heard back within a few days. You may see your results on mychart before we have a chance to review them but we will give you a call once they are reviewed by [place].   If we ordered any referrals today, please let us know if you have not heard from their office within the next week.   If you had any urgent prescriptions sent in today, please check with the pharmacy within an hour of our visit to make sure the prescription was transmitted appropriately.   Please try these tips to maintain a healthy lifestyle:  Eat at least 3 REAL meals and 1-2 snacks per day.  Aim for no more than 5 hours between eating.  If you eat breakfast, please do so within one hour of getting up.   Each meal should contain half fruits/vegetables, one quarter protein, and one quarter carbs (no bigger than a computer mouse)  Cut down on sweet beverages. This includes juice, soda, and sweet tea.   Drink at least 1 glass of water with each meal and aim for at least 8 glasses per day  Exercise at least 150 minutes every week.

## 2023-01-18 NOTE — Progress Notes (Signed)
   Shelly Yoder is a 77 y.o. female who presents today for an office visit.  Assessment/Plan:  Chronic Problems Addressed Today: Type 2 diabetes mellitus with hyperglycemia, without long-term current use of insulin (HCC) Recheck A1c.  Hypertension associated with diabetes (HCC) Elevated today.  Previously at goal.  She will continue to monitor at home and let us know if persistently elevated.  Will continue amlodipine 10 mg daily.  Hyperlipidemia associated with type 2 diabetes mellitus (HCC) Check lipids.  She is doing well simvastatin 40 mg daily.     Subjective:  HPI:  See Assessment / plan for status of chronic conditions. Patient here today for follow up. She was last here about 6 months ago for her annual physical. At that time her A1c was 6.9 and LDL was 179.  We recommend starting Lipitor 40 mg daily however the declined and wish to proceed with simvastatin 40 mg daily.  She has done well with this.  She is in the middle of moving has not had much of a chance to work on diet and exercise though feels like she will have more of a chance to do this when she completes her move.       Objective:  Physical Exam: BP (!) 158/70   Pulse 77   Temp 97.7 F (36.5 C) (Temporal)   Wt 211 lb 6.4 oz (95.9 kg)   LMP  (LMP Unknown)   SpO2 98%   BMI 31.22 kg/m   Wt Readings from Last 3 Encounters:  01/18/23 211 lb 6.4 oz (95.9 kg)  07/05/22 212 lb 12.8 oz (96.5 kg)  06/29/22 212 lb (96.2 kg)    Gen: No acute distress, resting comfortably CV: Regular rate and rhythm with no murmurs appreciated Pulm: Normal work of breathing, clear to auscultation bilaterally with no crackles, wheezes, or rhonchi Neuro: Grossly normal, moves all extremities Psych: Normal affect and thought content      Eylin Pontarelli M. Jimmey Ralph, MD 01/18/2023 9:41 AM

## 2023-01-18 NOTE — Assessment & Plan Note (Signed)
Recheck A1c 

## 2023-01-18 NOTE — Assessment & Plan Note (Signed)
Elevated today.  Previously at goal.  She will continue to monitor at home and let us know if persistently elevated.  Will continue amlodipine 10 mg daily.

## 2023-01-20 NOTE — Progress Notes (Signed)
A1c is stable at 6.8.  LDL is still elevated - can we make sure that she has been taking her simvastatin? If so, recommend we change to crestor 20 mg daily.   We can recheck in 3 to 6 months.

## 2023-01-28 ENCOUNTER — Other Ambulatory Visit: Payer: Self-pay | Admitting: *Deleted

## 2023-01-28 MED ORDER — ROSUVASTATIN CALCIUM 20 MG PO TABS: 20.0000 mg | ORAL_TABLET | Freq: Every day | ORAL | 3 refills | Status: AC

## 2023-01-28 NOTE — Progress Notes (Signed)
Ok to change to crestor 20 mg daily.  Katina Degree. Jimmey Ralph, MD 01/28/2023 12:41 PM

## 2023-02-08 ENCOUNTER — Ambulatory Visit (INDEPENDENT_AMBULATORY_CARE_PROVIDER_SITE_OTHER): Payer: Medicare Other | Admitting: Family Medicine

## 2023-02-08 ENCOUNTER — Encounter: Payer: Self-pay | Admitting: Family Medicine

## 2023-02-08 VITALS — BP 178/97 | HR 78 | Temp 98.4°F | Ht 69.0 in | Wt 208.4 lb

## 2023-02-08 DIAGNOSIS — I152 Hypertension secondary to endocrine disorders: Secondary | ICD-10-CM

## 2023-02-08 DIAGNOSIS — L0291 Cutaneous abscess, unspecified: Secondary | ICD-10-CM

## 2023-02-08 DIAGNOSIS — E1159 Type 2 diabetes mellitus with other circulatory complications: Secondary | ICD-10-CM | POA: Diagnosis not present

## 2023-02-08 MED ORDER — DOXYCYCLINE HYCLATE 100 MG PO TABS
100.0000 mg | ORAL_TABLET | Freq: Two times a day (BID) | ORAL | 0 refills | Status: DC
Start: 2023-02-08 — End: 2023-07-05

## 2023-02-08 NOTE — Patient Instructions (Addendum)
It was very nice to see you today!  You have an infection in your skin on your breast.  Please start the doxycycline.  Let me know if not improved by next week and we will order an ultrasound to further evaluate.   Return if symptoms worsen or fail to improve.   Take care, Dr Jimmey Ralph  PLEASE NOTE:  If you had any lab tests, please let us know if you have not heard back within a few days. You may see your results on mychart before we have a chance to review them but we will give you a call once they are reviewed by [place].   If we ordered any referrals today, please let us know if you have not heard from their office within the next week.   If you had any urgent prescriptions sent in today, please check with the pharmacy within an hour of our visit to make sure the prescription was transmitted appropriately.   Please try these tips to maintain a healthy lifestyle:  Eat at least 3 REAL meals and 1-2 snacks per day.  Aim for no more than 5 hours between eating.  If you eat breakfast, please do so within one hour of getting up.   Each meal should contain half fruits/vegetables, one quarter protein, and one quarter carbs (no bigger than a computer mouse)  Cut down on sweet beverages. This includes juice, soda, and sweet tea.   Drink at least 1 glass of water with each meal and aim for at least 8 glasses per day  Exercise at least 150 minutes every week.

## 2023-02-08 NOTE — Assessment & Plan Note (Signed)
Elevated today however she has been under a lot of stress recently.  Already to typically a goal that she has previously been well-controlled.  She will continue to monitor at home and let us know if persistently elevated.  Continue amlodipine 10 mg daily.

## 2023-02-08 NOTE — Progress Notes (Signed)
   Shelly Yoder is a 77 y.o. female who presents today for an office visit.  Assessment/Plan:  New/Acute Problems: Abscess  Lesion on right breast consistent with abscess.  No signs of systemic illness.  No areas amenable to I&D today.  We will treat with course of doxycycline.  It is reassuring that symptoms have begun to improve over the last couple of days -doubt that this represents more serious etiology such as malignancy, especially given her normal mammogram 6 months ago , however if she does not improve with antibiotics over the next few days we will order an ultrasound / diagnostic mammogram to further evaluate. We discussed reasons to return to care.  Chronic Problems Addressed Today: Hypertension associated with diabetes (HCC) Elevated today however she has been under a lot of stress recently.  Already to typically a goal that she has previously been well-controlled.  She will continue to monitor at home and let us know if persistently elevated.  Continue amlodipine 10 mg daily.     Subjective:  HPI:  See Assessment / plan for status of chronic conditions. Her main concern today is lesion on her right breast. She first noticed this about a week ago.  Had sudden onset pain and swelling to the area.  Also some redness and irritation.  She was monitoring at home.  Symptoms have improved significantly over the last couple of days.  She has not noticed any drainage.  She is only having mild pain now.  No fevers or chills.  She has been under more stress recently. She is currently in the process of moving.         Objective:  Physical Exam: BP (!) 178/97   Pulse 78   Temp 98.4 F (36.9 C) (Temporal)   Ht 5\' 9"  (1.753 m)   Wt 208 lb 6.4 oz (94.5 kg)   LMP  (LMP Unknown)   SpO2 100%   BMI 30.78 kg/m   Gen: No acute distress, resting comfortably Breast: Chaperone present for exam.  Small indurated approximately 1 cm firm lesion at the 5 o'clock position on right breast. Erythema.  Mildly tender. No fluctuance. Neuro: Grossly normal, moves all extremities Psych: Normal affect and thought content      Suleika Donavan M. Jimmey Ralph, MD 02/08/2023 2:06 PM

## 2023-06-28 ENCOUNTER — Ambulatory Visit: Admitting: Family Medicine

## 2023-07-04 ENCOUNTER — Encounter: Payer: Self-pay | Admitting: Family Medicine

## 2023-07-04 ENCOUNTER — Ambulatory Visit (INDEPENDENT_AMBULATORY_CARE_PROVIDER_SITE_OTHER): Admitting: Family Medicine

## 2023-07-04 VITALS — BP 145/74 | HR 75 | Temp 97.3°F | Ht 69.0 in | Wt 202.0 lb

## 2023-07-04 DIAGNOSIS — I152 Hypertension secondary to endocrine disorders: Secondary | ICD-10-CM

## 2023-07-04 DIAGNOSIS — E785 Hyperlipidemia, unspecified: Secondary | ICD-10-CM | POA: Diagnosis not present

## 2023-07-04 DIAGNOSIS — E1169 Type 2 diabetes mellitus with other specified complication: Secondary | ICD-10-CM

## 2023-07-04 DIAGNOSIS — E1159 Type 2 diabetes mellitus with other circulatory complications: Secondary | ICD-10-CM | POA: Diagnosis not present

## 2023-07-04 DIAGNOSIS — E1165 Type 2 diabetes mellitus with hyperglycemia: Secondary | ICD-10-CM

## 2023-07-04 NOTE — Assessment & Plan Note (Signed)
 Last A1c was at goal without meds.  Recheck A1c when she comes back in for next office visit with labs.

## 2023-07-04 NOTE — Progress Notes (Addendum)
   Shelly Yoder is a 78 y.o. female who presents today for an office visit.  Assessment/Plan:  Chronic Problems Addressed Today: Hypertension associated with diabetes (HCC) Elevated today.  Typically well-controlled at home.  She will monitor at home and let us  know if persistently elevated.  Continue amlodipine  10 mg daily.  Type 2 diabetes mellitus with hyperglycemia, without long-term current use of insulin (HCC) Last A1c was at goal without meds.  Recheck A1c when she comes back in for next office visit with labs.  Hyperlipidemia associated with type 2 diabetes mellitus (HCC) Stable on simvastatin  40 mg daily.  Recheck lipids when she comes back in work next office visit with labs.  Encounter for form completion Will complete FL 2 form per patient request.    Subjective:  HPI:  See Assessment / plan for status of chronic conditions.  She is here today for [facility] form completion. She is planning on getting special assistance at home with her husband and they require this form to be filled out.  She is doing well today without any acute concerns.       Objective:  Physical Exam: BP (!) 145/74 Comment: at home this morning  Pulse 75   Temp (!) 97.3 F (36.3 C) (Temporal)   Ht 5\' 9"  (1.753 m)   Wt 202 lb (91.6 kg)   LMP  (LMP Unknown)   SpO2 98%   BMI 29.83 kg/m   Gen: No acute distress, resting comfortably CV: Regular rate and rhythm with no murmurs appreciated Pulm: Normal work of breathing, clear to auscultation bilaterally with no crackles, wheezes, or rhonchi Neuro: Grossly normal, moves all extremities Psych: Normal affect and thought content  Time Spent: 45 minutes of total time was spent on the date of the encounter performing the following actions: chart review prior to seeing the patient, obtaining history, performing a medically necessary exam, completing her FL2 form, counseling on the treatment plan, placing orders, and documenting in our EHR.        Jinny Mounts. Daneil Dunker, MD 07/04/2023 12:30 PM

## 2023-07-04 NOTE — Assessment & Plan Note (Signed)
 Elevated today.  Typically well-controlled at home.  She will monitor at home and let us  know if persistently elevated.  Continue amlodipine  10 mg daily.

## 2023-07-04 NOTE — Assessment & Plan Note (Signed)
 Stable on simvastatin  40 mg daily.  Recheck lipids when she comes back in work next office visit with labs.

## 2023-07-04 NOTE — Patient Instructions (Signed)
 It was very nice to see you today!  We will complete your FL 2 form.  Please monitor your blood pressure and let us  know if persistently elevated.  Return if symptoms worsen or fail to improve.   Take care, Dr Daneil Dunker  PLEASE NOTE:  If you had any lab tests, please let us  know if you have not heard back within a few days. You may see your results on mychart before we have a chance to review them but we will give you a call once they are reviewed by us .   If we ordered any referrals today, please let us  know if you have not heard from their office within the next week.   If you had any urgent prescriptions sent in today, please check with the pharmacy within an hour of our visit to make sure the prescription was transmitted appropriately.   Please try these tips to maintain a healthy lifestyle:  Eat at least 3 REAL meals and 1-2 snacks per day.  Aim for no more than 5 hours between eating.  If you eat breakfast, please do so within one hour of getting up.   Each meal should contain half fruits/vegetables, one quarter protein, and one quarter carbs (no bigger than a computer mouse)  Cut down on sweet beverages. This includes juice, soda, and sweet tea.   Drink at least 1 glass of water with each meal and aim for at least 8 glasses per day  Exercise at least 150 minutes every week.

## 2023-07-05 ENCOUNTER — Ambulatory Visit (INDEPENDENT_AMBULATORY_CARE_PROVIDER_SITE_OTHER): Payer: Medicare Other

## 2023-07-05 VITALS — Ht 69.0 in | Wt 202.0 lb

## 2023-07-05 DIAGNOSIS — E1159 Type 2 diabetes mellitus with other circulatory complications: Secondary | ICD-10-CM

## 2023-07-05 DIAGNOSIS — I152 Hypertension secondary to endocrine disorders: Secondary | ICD-10-CM

## 2023-07-05 DIAGNOSIS — Z0000 Encounter for general adult medical examination without abnormal findings: Secondary | ICD-10-CM

## 2023-07-05 NOTE — Patient Instructions (Signed)
 Shelly Yoder , Thank you for taking time to come for your Medicare Wellness Visit. I appreciate your ongoing commitment to your health goals. Please review the following plan we discussed and let me know if I can assist you in the future.   Referrals/Orders/Follow-Ups/Clinician Recommendations: Aim for 30 minutes of exercise or brisk walking, 6-8 glasses of water, and 5 servings of fruits and vegetables each day.continue to work on losing weight lowering A1c and blood pressure  Maintain health and activity   This is a list of the screening recommended for you and due dates:  Health Maintenance  Topic Date Due   Pneumonia Vaccine (1 of 2 - PCV) Never done   COVID-19 Vaccine (7 - 2024-25 season) 11/07/2022   Yearly kidney function blood test for diabetes  07/05/2023   Yearly kidney health urinalysis for diabetes  07/05/2023   Hemoglobin A1C  07/18/2023   Flu Shot  10/07/2023   Eye exam for diabetics  11/01/2023   Complete foot exam   05/15/2024   Medicare Annual Wellness Visit  07/04/2024   Colon Cancer Screening  08/24/2024   DEXA scan (bone density measurement)  Completed   Hepatitis C Screening  Completed   Zoster (Shingles) Vaccine  Completed   HPV Vaccine  Aged Out   Meningitis B Vaccine  Aged Out   DTaP/Tdap/Td vaccine  Discontinued    Advanced directives: (In Chart) A copy of your advanced directives are scanned into your chart should your provider ever need it.  Next Medicare Annual Wellness Visit scheduled for next year: Yes

## 2023-07-05 NOTE — Progress Notes (Signed)
 Subjective:   Shelly Yoder is a 78 y.o. who presents for a Medicare Wellness preventive visit.  Visit Complete: Virtual I connected with  Shelly Yoder on 07/05/23 by a audio enabled telemedicine application and verified that I am speaking with the correct person using two identifiers.  Patient Location: Home  Provider Location: Office/Clinic  I discussed the limitations of evaluation and management by telemedicine. The patient expressed understanding and agreed to proceed.  Vital Signs: Because this visit was a virtual/telehealth visit, some criteria may be missing or patient reported. Any vitals not documented were not able to be obtained and vitals that have been documented are patient reported.  VideoDeclined- This patient declined Librarian, academic. Therefore the visit was completed with audio only.  Persons Participating in Visit: Patient.  AWV Questionnaire: No: Patient Medicare AWV questionnaire was not completed prior to this visit.  Cardiac Risk Factors include: advanced age (>42men, >59 women);dyslipidemia;diabetes mellitus;hypertension     Objective:    Today's Vitals   07/05/23 0817  Weight: 202 lb (91.6 kg)  Height: 5\' 9"  (1.753 m)   Body mass index is 29.83 kg/m.     07/05/2023    8:21 AM 06/29/2022    9:10 AM 02/16/2021    8:15 AM 02/15/2020    8:21 AM 01/18/2019    2:20 PM 06/12/2015   10:32 AM  Advanced Directives  Does Patient Have a Medical Advance Directive? Yes Yes Yes Yes Yes Yes  Type of Estate agent of [place];Living will Healthcare Power of [place];Living will Healthcare Power of [employer] of [place];Living will Living will;Healthcare Power of [employer] Power of Attorney  Does patient want to make changes to medical advance directive? No - Patient declined No - Patient declined   No - Patient declined   Copy of Healthcare Power of Attorney in Chart? Yes - validated most  recent copy scanned in chart (See row information) Yes - validated most recent copy scanned in chart (See row information) No - copy requested No - copy requested No - copy requested     Current Medications (verified) Outpatient Encounter Medications as of 07/05/2023  Medication Sig   amLODipine  (NORVASC ) 10 MG tablet Take 1 tablet (10 mg total) by mouth daily.   CALCIUM  PO Take 1 tablet by mouth daily at 6 (six) AM.   Cyanocobalamin  (VITAMIN B 12 PO) Take 1 tablet by mouth daily at 6 (six) AM.   glucose blood (ONETOUCH VERIO) test strip USE TO CHECK BLOOD SUGAR DAILY AND AS NEEDED   Lancets (ONETOUCH DELICA PLUS LANCET33G) MISC USE TO CHECK BLOOD SUGAR DAILY AND AS NEEDED   Multiple Vitamin (MULTI-VITAMINS) TABS Take 1 tablet by mouth daily at 6 (six) AM.   rosuvastatin  (CRESTOR ) 20 MG tablet Take 1 tablet (20 mg total) by mouth daily.   VITAMIN D , CHOLECALCIFEROL, PO Take 1 tablet by mouth daily at 6 (six) AM. daily   [DISCONTINUED] doxycycline  (VIBRA -TABS) 100 MG tablet Take 1 tablet (100 mg total) by mouth 2 (two) times daily.   No facility-administered encounter medications on file as of 07/05/2023.    Allergies (verified) Atorvastatin and Hydrocodone-acetaminophen   History: Past Medical History:  Diagnosis Date   Anemia    Anxiety    situational - no meds   Arthritis    possibly    Bilateral hearing loss 03/29/2018   no hearing aids   Cataracts, bilateral    forming both eyes - just watching  Colon polyps    Depression    no meds   Diabetes mellitus without complication (HCC) 2014    Diet controlled  no meds   Heart murmur    no problems   Hyperlipidemia    Hyperlipidemia associated with type 2 diabetes mellitus (HCC) 03/29/2018   Hypertension    no meds, working on diet.weight loss   Hypertension associated with diabetes (HCC) 03/29/2018   Lipoma    Lipoma of torso 03/29/2018   Refusal of blood transfusions as patient is Jehovah's Witness 12/04/2014   Seasonal  allergies    Type 2 diabetes mellitus with hyperglycemia, without long-term current use of insulin (HCC) 03/29/2018   Vitamin D  deficiency 03/29/2018   Past Surgical History:  Procedure Laterality Date   ABDOMINAL HYSTERECTOMY  1996   partial    BREAST BIOPSY Right    COLONOSCOPY  06/12/2015   COLONOSCOPY  2021   MS-MAC-sutab -(good)-10 polyps-TA/HPP/tics   LIPOMA EXCISION     upper back   POLYPECTOMY  2021   HPP/TA   Family History  Problem Relation Age of Onset   Diabetes Mother    Heart attack Mother    Hyperlipidemia Mother    Heart disease Mother 73   Heart block Mother    Kidney disease Mother    Alcohol abuse Father    Diabetes Father    Heart disease Father    Kidney disease Father    Breast cancer Sister    Diabetes Brother    Hypertension Brother    Kidney disease Brother    Colon cancer Neg Hx    Esophageal cancer Neg Hx    Stomach cancer Neg Hx    Rectal cancer Neg Hx    Colon polyps Neg Hx    Social History   Socioeconomic History   Marital status: Married    Spouse name: Not on file   Number of children: 3   Years of education: Not on file   Highest education level: Not on file  Occupational History   Occupation: Retired    Comment: Marketing executive   Tobacco Use   Smoking status: Former    Current packs/day: 0.00    Types: Cigarettes    Quit date: 06/15/1975    Years since quitting: 48.0   Smokeless tobacco: Never  Vaping Use   Vaping status: Never Used  Substance and Sexual Activity   Alcohol use: Not Currently    Alcohol/week: 0.0 - 1.0 standard drinks of alcohol    Comment: occasionally Wine/Beer   Drug use: No   Sexual activity: Not Currently    Birth control/protection: Surgical    Comment: Hysterectomy  Other Topics Concern   Not on file  Social History Narrative   Not on file   Social Drivers of Health   Financial Resource Strain: Low Risk  (07/05/2023)   Overall Financial Resource Strain (CARDIA)    Difficulty of Paying  Living Expenses: Not hard at all  Food Insecurity: No Food Insecurity (07/05/2023)   Hunger Vital Sign    Worried About Running Out of Food in the Last Year: Never true    Ran Out of Food in the Last Year: Never true  Transportation Needs: No Transportation Needs (07/05/2023)   PRAPARE - Administrator, Civil Service (Medical): No    Lack of Transportation (Non-Medical): No  Physical Activity: Insufficiently Active (07/05/2023)   Exercise Vital Sign    Days of Exercise per Week: 3 days  Minutes of Exercise per Session: 30 min  Stress: No Stress Concern Present (07/05/2023)   [employer] of Occupational Health - Occupational Stress Questionnaire    Feeling of Stress : Only a little  Social Connections: Moderately Integrated (07/05/2023)   Social Connection and Isolation Panel [NHANES]    Frequency of Communication with Friends and Family: More than three times a week    Frequency of Social Gatherings with Friends and Family: More than three times a week    Attends Religious Services: More than 4 times per year    Active Member of [employer] or Organizations: No    Attends Engineer, structural: Never    Marital Status: Married    Tobacco Counseling Counseling given: Not Answered    Clinical Intake:  Pre-visit preparation completed: Yes  Pain : No/denies pain     BMI - recorded: 29.83 Nutritional Status: BMI 25 -29 Overweight Nutritional Risks: None Diabetes: Yes CBG done?: No Did pt. bring in CBG monitor from home?: No  Lab Results  Component Value Date   HGBA1C 6.8 (H) 01/18/2023   HGBA1C 6.9 (H) 07/05/2022   HGBA1C 6.9 (H) 09/29/2021     How often do you need to have someone help you when you read instructions, pamphlets, or other written materials from your doctor or pharmacy?: 1 - Never  Interpreter Needed?: No  Information entered by :: Lamont Pilsner, LPN   Activities of Daily Living     07/05/2023    8:19 AM  In your present state  of health, do you have any difficulty performing the following activities:  Hearing? 1  Comment tinnitus  Vision? 0  Difficulty concentrating or making decisions? 0  Walking or climbing stairs? 0  Dressing or bathing? 0  Doing errands, shopping? 0  Preparing Food and eating ? N  Using the Toilet? N  In the past six months, have you accidently leaked urine? N  Do you have problems with loss of bowel control? N  Managing your Medications? N  Managing your Finances? N  Housekeeping or managing your Housekeeping? N    Patient Care Team: Rodney Clamp, MD as PCP - General (Family Medicine) Asencion Blacksmith, MD (Inactive) as Consulting Physician (Gastroenterology) Reynold Caves, MD as Consulting Physician (Otolaryngology) [facility] as Consulting Physician Denman Fischer, MD as Consulting Physician (Dermatology)  Indicate any recent Medical Services you may have received from other than [facility] providers in the past year (date may be approximate).     Assessment:   This is a routine wellness examination for [facility].  Hearing/Vision screen Hearing Screening - Comments:: Tinnitus  Vision Screening - Comments:: Wears rx glasses - up to date with routine eye exams with dr Candi Chafe may switch to [facility] with husband     Goals Addressed             This Visit's Progress    Patient Stated       Aim for 30 minutes of exercise or brisk walking, 6-8 glasses of water, and 5 servings of fruits and vegetables each day.        Depression Screen     07/05/2023    8:22 AM 07/04/2023   11:37 AM 02/08/2023    1:06 PM 01/18/2023    9:09 AM 01/18/2023    9:08 AM 07/05/2022    9:22 AM 06/29/2022    9:07 AM  PHQ 2/9 Scores  PHQ - 2 Score 0 0 0 0 0 0  0  PHQ- 9 Score   0 0  0     Fall Risk     07/05/2023    8:24 AM 07/04/2023   11:37 AM 02/08/2023    1:06 PM 07/05/2022    9:22 AM 06/29/2022    9:11 AM  Fall Risk   Falls in the past year? 0 0 0 0 0  Number falls in past yr: 0 0 0  0 0  Injury with Fall? 0 0 0 0 0  Risk for fall due to : No Fall Risks No Fall Risks No Fall Risks No Fall Risks Impaired vision  Follow up Falls prevention discussed   Falls prevention discussed Falls prevention discussed    MEDICARE RISK AT HOME:  Medicare Risk at Home Any stairs in or around the home?: Yes If so, are there any without handrails?: No Home free of loose throw rugs in walkways, pet beds, electrical cords, etc?: Yes Adequate lighting in your home to reduce risk of falls?: Yes Life alert?: No Use of a cane, walker or w/c?: No Grab bars in the bathroom?: Yes Shower chair or bench in shower?: No Elevated toilet seat or a handicapped toilet?: No  TIMED UP AND GO:  Was the test performed?  No  Cognitive Function: 6CIT completed        07/05/2023    8:25 AM 06/29/2022    9:12 AM 02/16/2021    8:18 AM 02/15/2020    8:33 AM  6CIT Screen  What Year? 0 points 0 points 0 points 0 points  What month? 0 points 0 points 0 points 0 points  What time? 0 points 0 points 0 points   Count back from 20 0 points 0 points 0 points 0 points  Months in reverse 2 points 0 points 0 points 0 points  Repeat phrase 0 points 0 points 0 points 0 points  Total Score 2 points 0 points 0 points     Immunizations Immunization History  Administered Date(s) Administered   PFIZER Comirnaty (Gray Top)Covid-19 Tri-Sucrose Vaccine 07/28/2020   PFIZER(Purple Top)SARS-COV-2 Vaccination 04/12/2019, 05/07/2019, 11/13/2019   Pfizer Covid-19 Vaccine Bivalent Booster 58yrs & up 11/26/2020   Pfizer(Comirnaty )Fall Seasonal Vaccine 12 years and older 12/28/2021   Zoster Recombinant(Shingrix ) 08/28/2021, 01/19/2022    Screening Tests Health Maintenance  Topic Date Due   Pneumonia Vaccine 79+ Years old (1 of 2 - PCV) Never done   COVID-19 Vaccine (7 - 2024-25 season) 11/07/2022   Diabetic kidney evaluation - eGFR measurement  07/05/2023   Diabetic kidney evaluation - Urine ACR  07/05/2023    HEMOGLOBIN A1C  07/18/2023   INFLUENZA VACCINE  10/07/2023   OPHTHALMOLOGY EXAM  11/01/2023   FOOT EXAM  05/15/2024   Medicare Annual Wellness (AWV)  07/04/2024   Colonoscopy  08/24/2024   DEXA SCAN  Completed   Hepatitis C Screening  Completed   Zoster Vaccines- Shingrix   Completed   HPV VACCINES  Aged Out   Meningococcal B Vaccine  Aged Out   DTaP/Tdap/Td  Discontinued    Health Maintenance  Health Maintenance Due  Topic Date Due   Pneumonia Vaccine 11+ Years old (1 of 2 - PCV) Never done   COVID-19 Vaccine (7 - 2024-25 season) 11/07/2022   Diabetic kidney evaluation - eGFR measurement  07/05/2023   Diabetic kidney evaluation - Urine ACR  07/05/2023   Health Maintenance Items Addressed: See Nurse Notes  Additional Screening:  Vision Screening: Recommended annual ophthalmology exams for early detection of glaucoma  and other disorders of the eye.  Dental Screening: Recommended annual dental exams for proper oral hygiene  Community Resource Referral / Chronic Care Management: CRR required this visit?  No   CCM required this visit?  No     Plan:     I have personally reviewed and noted the following in the patient's chart:   Medical and social history Use of alcohol, tobacco or illicit drugs  Current medications and supplements including opioid prescriptions. Patient is not currently taking opioid prescriptions. Functional ability and status Nutritional status Physical activity Advanced directives List of other physicians Hospitalizations, surgeries, and ER visits in previous 12 months Vitals Screenings to include cognitive, depression, and falls Referrals and appointments  In addition, I have reviewed and discussed with patient certain preventive protocols, quality metrics, and best practice recommendations. A written personalized care plan for preventive services as well as general preventive health recommendations were provided to patient.     Bruno Capri, LPN   [id number]   After Visit Summary: (MyChart) Due to this being a telephonic visit, the after visit summary with patients personalized plan was offered to patient via MyChart   Notes: Nothing significant to report at this time.

## 2023-07-25 ENCOUNTER — Other Ambulatory Visit: Payer: Self-pay | Admitting: Family Medicine

## 2023-07-25 NOTE — Telephone Encounter (Unsigned)
 Copied from CRM [id number]. Topic: Clinical - Medication Refill >> Jul 25, 2023 11:07 AM Vivian Z wrote: Medication: amLODipine  (NORVASC ) 10 MG tablet  Has the patient contacted their pharmacy? Yes (Agent: If no, request that the patient contact the pharmacy for the refill. If patient does not wish to contact the pharmacy document the reason why and proceed with request.) (Agent: If yes, when and what did the pharmacy advise?) Pharmacy advised that prescription expired in march.  This is the patient's preferred pharmacy:  [id number] - [place], [place] - [address] [place] [address] Phone: [phone] Fax: [phone]  Is this the correct pharmacy for this prescription? Yes If no, delete pharmacy and type the correct one.   Has the prescription been filled recently? No  Is the patient out of the medication? Yes  Has the patient been seen for an appointment in the last year OR does the patient have an upcoming appointment? Yes  Can we respond through MyChart? No  Agent: Please be advised that Rx refills may take up to 3 business days. We ask that you follow-up with your pharmacy.

## 2023-08-16 ENCOUNTER — Other Ambulatory Visit: Payer: Self-pay | Admitting: Family Medicine

## 2023-08-16 DIAGNOSIS — Z1231 Encounter for screening mammogram for malignant neoplasm of breast: Secondary | ICD-10-CM

## 2023-08-22 ENCOUNTER — Ambulatory Visit
Admission: RE | Admit: 2023-08-22 | Discharge: 2023-08-22 | Disposition: A | Discharge status: Home / Self Care | Source: Ambulatory Visit | Attending: Family Medicine | Admitting: Family Medicine

## 2023-08-22 DIAGNOSIS — Z1231 Encounter for screening mammogram for malignant neoplasm of breast: Secondary | ICD-10-CM | POA: Diagnosis not present

## 2023-11-30 ENCOUNTER — Ambulatory Visit: Payer: Self-pay

## 2023-11-30 NOTE — Telephone Encounter (Signed)
 Noted

## 2023-11-30 NOTE — Telephone Encounter (Signed)
 FYI Only or Action Required?: Action required by provider: request for appointment.  Patient was last seen in primary care on 07/04/2023 by Kennyth Worth HERO, MD.  Called Nurse Triage reporting Numbness.  Symptoms began a week ago.  Interventions attempted: Rest, hydration, or home remedies.  Symptoms are: unchanged.  Triage Disposition: See PCP When Office is Open (Within 3 Days)  Patient/caregiver understands and will follow disposition?: Yes  Copied from CRM #[id number]. Topic: Clinical - Red Word Triage >> Nov 30, 2023  9:49 AM Tinnie BROCKS wrote: Red Word that prompted transfer to Nurse Triage: Numbness/neuropathy in feet. Reason for Disposition  [1] Numbness or tingling in one or both feet AND [2] is a chronic symptom (recurrent or ongoing AND present > 4 weeks)  Answer Assessment - Initial Assessment Questions Offered appt tomorrow, pt declined and requested appt next Wednesday; scheduled 12/07/23. Advised UC/ ED if symptoms worsen.  1. SYMPTOM: What is the main symptom you are concerned about? (e.g., weakness, numbness)     Numbness at bottom of both feet; denies swelling, discolored, 2. ONSET: When did this start? (e.g., minutes, hours, days; while sleeping)     Few weeks  4. PATTERN Does this come and go, or has it been constant since it started?  Is it present now?     Comes and goes 5. CARDIAC SYMPTOMS: Have you had any of the following symptoms: chest pain, difficulty breathing, palpitations?     denies 6. NEUROLOGIC SYMPTOMS: Have you had any of the following symptoms: headache, dizziness, vision loss, double vision, changes in speech, unsteady on your feet?     denies 7. OTHER SYMPTOMS: Do you have any other symptoms?     no Massages with lotion, feels better; when moving toes up and down feel tightness Type II DM  Protocols used: Neurologic Deficit-A-AH

## 2023-12-05 ENCOUNTER — Ambulatory Visit: Payer: Self-pay

## 2023-12-05 NOTE — Telephone Encounter (Signed)
 Noted

## 2023-12-05 NOTE — Telephone Encounter (Signed)
 FYI Only or Action Required?: FYI only for provider. Appointment rescheduled to 10/7  Patient was last seen in primary care on 07/04/2023 by Kennyth Worth HERO, MD.  Called Nurse Triage reporting Appointment.  Symptoms began NA.  Interventions attempted: Other: NA.  Symptoms are: NA.  Triage Disposition: No disposition on file.  Patient/caregiver understands and will follow disposition?:   Copied from CRM [id number]. Topic: Clinical - Red Word Triage >> Dec 05, 2023  3:08 PM Timindy P wrote: [facility] that prompted transfer to Nurse Triage: Numbness and tingling in feet    ----------------------------------------------------------------------- From previous Reason for Contact - Cancel/Reschedule: Patient/patient representative is calling to cancel or reschedule an appointment. Refer to attachments for appointment information.   Pt was triaged for this appt, getting back to triage RN Reason for Disposition  Health information question, no triage required and triager able to answer question  Answer Assessment - Initial Assessment Questions 1. REASON FOR CALL: What is the main reason for your call? or How can I best help you?     Patient unable to make appointment on 10/1- rescheduled for 10/7. No worsening of symptoms. ED precautions reviewed and patient expressed understanding.  Protocols used: Information Only Call - No Triage-A-AH

## 2023-12-06 ENCOUNTER — Ambulatory Visit: Admitting: Family Medicine

## 2023-12-07 ENCOUNTER — Ambulatory Visit: Admitting: Family Medicine

## 2023-12-13 ENCOUNTER — Encounter: Payer: Self-pay | Admitting: Family Medicine

## 2023-12-13 ENCOUNTER — Ambulatory Visit: Admitting: Family Medicine

## 2023-12-13 VITALS — BP 120/82 | HR 77 | Temp 97.2°F | Ht 69.0 in | Wt 199.4 lb

## 2023-12-13 DIAGNOSIS — E1165 Type 2 diabetes mellitus with hyperglycemia: Secondary | ICD-10-CM | POA: Diagnosis not present

## 2023-12-13 DIAGNOSIS — R202 Paresthesia of skin: Secondary | ICD-10-CM

## 2023-12-13 DIAGNOSIS — E1159 Type 2 diabetes mellitus with other circulatory complications: Secondary | ICD-10-CM

## 2023-12-13 DIAGNOSIS — I152 Hypertension secondary to endocrine disorders: Secondary | ICD-10-CM

## 2023-12-13 DIAGNOSIS — E559 Vitamin D deficiency, unspecified: Secondary | ICD-10-CM

## 2023-12-13 LAB — POCT GLYCOSYLATED HEMOGLOBIN (HGB A1C): Hemoglobin A1C: 6.3 % — AB (ref 4.0–5.6)

## 2023-12-13 LAB — COMPREHENSIVE METABOLIC PANEL WITH GFR
ALT: 9 U/L (ref 0–35)
AST: 18 U/L (ref 0–37)
Albumin: 4.1 g/dL (ref 3.5–5.2)
Alkaline Phosphatase: 57 U/L (ref 39–117)
BUN: 14 mg/dL (ref 6–23)
CO2: 28 meq/L (ref 19–32)
Calcium: 9.7 mg/dL (ref 8.4–10.5)
Chloride: 104 meq/L (ref 96–112)
Creatinine, Ser: 0.69 mg/dL (ref 0.40–1.20)
GFR: 83.6 mL/min (ref >60.00)
Glucose, Bld: 116 mg/dL — ABNORMAL HIGH (ref 70–99)
Potassium: 4.1 meq/L (ref 3.5–5.1)
Sodium: 141 meq/L (ref 135–145)
Total Bilirubin: 0.4 mg/dL (ref 0.2–1.2)
Total Protein: 7.6 g/dL (ref 6.0–8.3)

## 2023-12-13 LAB — MICROALBUMIN / CREATININE URINE RATIO
Creatinine,U: 207.9 mg/dL
Microalb Creat Ratio: 19.3 mg/g (ref 0.0–30.0)
Microalb, Ur: 4 mg/dL — ABNORMAL HIGH (ref 0.0–1.9)

## 2023-12-13 LAB — CBC
HCT: 38.3 % (ref 36.0–46.0)
Hemoglobin: 12.4 g/dL (ref 12.0–15.0)
MCHC: 32.2 g/dL (ref 30.0–36.0)
MCV: 85.2 fl (ref 78.0–100.0)
Platelets: 153 K/uL (ref 150.0–400.0)
RBC: 4.5 Mil/uL (ref 3.87–5.11)
RDW: 13.7 % (ref 11.5–15.5)
WBC: 4.9 K/uL (ref 4.0–10.5)

## 2023-12-13 LAB — VITAMIN B12: Vitamin B-12: 261 pg/mL (ref 211–911)

## 2023-12-13 LAB — VITAMIN D 25 HYDROXY (VIT D DEFICIENCY, FRACTURES): VITD: 25.08 ng/mL — ABNORMAL LOW (ref 30.00–100.00)

## 2023-12-13 LAB — FOLATE: Folate: 21.7 ng/mL (ref >5.9)

## 2023-12-13 NOTE — Patient Instructions (Signed)
 It was very nice to see you today!  VISIT SUMMARY: Today, we discussed your ongoing numbness and tingling in your legs and feet, and reviewed your well-controlled type 2 diabetes.  YOUR PLAN: PERIPHERAL NEUROPATHY: You have been experiencing numbness and tingling in your legs and feet for the past six months. This could be due to a vitamin B12 deficiency, folate deficiency, or a pinched nerve. -We will order blood tests today -If the blood tests do not provide answers and your symptoms continue or worsen, we may refer you to a neurologist for a nerve conduction study.  TYPE 2 DIABETES MELLITUS: Your type 2 diabetes is well-controlled with your current diet, as shown by your A1c level dropping from 6.8 to 6.3. -Continue with your current dietary management to keep your blood sugar levels under control. -We can provide verification of your type 2 diabetes diagnosis for Medicare benefits if needed.  Return in about 6 months (around 06/12/2024) for Annual Physical.   Take care, Dr Kennyth  PLEASE NOTE:  If you had any lab tests, please let us  know if you have not heard back within a few days. You may see your results on mychart before we have a chance to review them but we will give you a call once they are reviewed by us .   If we ordered any referrals today, please let us  know if you have not heard from their office within the next week.   If you had any urgent prescriptions sent in today, please check with the pharmacy within an hour of our visit to make sure the prescription was transmitted appropriately.   Please try these tips to maintain a healthy lifestyle:  Eat at least 3 REAL meals and 1-2 snacks per day.  Aim for no more than 5 hours between eating.  If you eat breakfast, please do so within one hour of getting up.   Each meal should contain half fruits/vegetables, one quarter protein, and one quarter carbs (no bigger than a computer mouse)  Cut down on sweet beverages. This  includes juice, soda, and sweet tea.   Drink at least 1 glass of water with each meal and aim for at least 8 glasses per day  Exercise at least 150 minutes every week.

## 2023-12-13 NOTE — Assessment & Plan Note (Signed)
At goal today on amlodipine 10 mg daily. 

## 2023-12-13 NOTE — Assessment & Plan Note (Signed)
 A1c at goal today 6.3 via diet.  She is working on reducing sugar intake.  Recheck A1c in 6 months.

## 2023-12-13 NOTE — Progress Notes (Signed)
   Shelly Yoder is a 78 y.o. female who presents today for an office visit.  Assessment/Plan:  New/Acute Problems: Paresthesia Patient with bilateral leg and foot paresthesias likely secondary to peripheral neuropathy.  Most recent A1cs have any goal-not sure how much is this is contributing.  Will check labs today.  Depending on results of this may consider referral to neurology.  Chronic Problems Addressed Today: Type 2 diabetes mellitus with hyperglycemia, without long-term current use of insulin (HCC) A1c at goal today 6.3 via diet.  She is working on reducing sugar intake.  Recheck A1c in 6 months.   Hypertension associated with diabetes (HCC) At goal today on amlodipine  10 mg daily.  Vitamin D  deficiency Check vitamin D  with labs.      Subjective:  HPI:  See assessment / plan for status of chronic conditions.    Discussed the use of AI scribe software for clinical note transcription with the patient, who gave verbal consent to proceed.  History of Present Illness Shelly Yoder is a 78 year old female with type 2 diabetes who presents with numbness and tingling in her legs and feet.  She has been experiencing intermittent numbness and tingling in both legs and the bottoms of her feet for the past six months, with occasional mild pain described as a 'twinge.' The sensation is primarily numbness rather than heaviness. She manages the symptoms by soaking her feet, removing dead skin, and applying lotion, which she feels is beneficial. There are no specific triggers for the symptoms, and she has not used any medication for treatment.  She is concerned about her symptoms due to her family history, as her mother, who had diabetes and was a kidney patient, lost both legs. She mentions a significant lifestyle change due to becoming legally blind.  Her type 2 diabetes is well-managed with dietary adjustments, as evidenced by her A1c level decreasing from 6.8 to 6.3. She is actively  monitoring her diet, particularly sweets and carbohydrates, and reports a decrease in weight.  She recalls receiving B12 injections many years ago but does not remember the specific reason for the deficiency. She is considering the possibility of a B12 deficiency contributing to her current symptoms.         Objective:  Physical Exam: BP 120/82   Pulse 77   Temp (!) 97.2 F (36.2 C) (Temporal)   Ht 5' 9 (1.753 m)   Wt 199 lb 6.4 oz (90.4 kg)   LMP  (LMP Unknown)   SpO2 98%   BMI 29.45 kg/m   Gen: No acute distress, resting comfortably CV: Regular rate and rhythm with no murmurs appreciated Pulm: Normal work of breathing, clear to auscultation bilaterally with no crackles, wheezes, or rhonchi MUSCULOSKELETAL: - Feet: Bunions noted bilaterally.  No other gross deformities.  Sensation light touch and monofilament testing intact bilaterally.  Distal pulses plus and symmetric bilaterally.  Normal distal cap refill. Neuro: Grossly normal, moves all extremities Psych: Normal affect and thought content      [place]any [place] M. Kennyth, MD 12/13/2023 11:09 AM

## 2023-12-13 NOTE — Assessment & Plan Note (Signed)
Check vitamin D with labs.

## 2023-12-15 LAB — METHYLMALONIC ACID, SERUM: Methylmalonic Acid, Quant: 781 nmol/L — ABNORMAL HIGH (ref 69–390)

## 2023-12-19 ENCOUNTER — Ambulatory Visit: Payer: Self-pay | Admitting: Family Medicine

## 2023-12-19 NOTE — Progress Notes (Signed)
 Her labs indicate that she has B12 deficiency.  This is probably contributing to her symptoms.  Recommend starting B12 protocol here.  Vitamin D  is also slightly low.  Recommend she start over-the-counter supplementation 5000 IUs daily if she is not doing so already.  We should recheck in 3 to 6 months.  All of her other labs are at goal and we can recheck at next office visit.

## 2023-12-27 NOTE — Progress Notes (Signed)
 BRITTINEY DICOSTANZO                                          MRN: [id number]   12/27/2023   The [facility] Quality Team Specialist reviewed this patient medical record for the purposes of chart review for care gap closure. The following were reviewed: abstraction for care gap closure-kidney health evaluation for diabetes:eGFR  and uACR.    [facility] Quality Team

## 2023-12-29 ENCOUNTER — Telehealth: Payer: Self-pay | Admitting: *Deleted

## 2023-12-29 NOTE — Telephone Encounter (Signed)
 Copied from CRM [id number]. Topic: Clinical - Lab/Test Results >> Dec 28, 2023  4:32 PM Shelly Yoder DEL wrote: Reason for CRM: Patient returned call regarding lab results. Message from provider was relayed to patient  verbalized understanding and has been scheduled for her first b12 injection.

## 2024-01-03 ENCOUNTER — Ambulatory Visit

## 2024-01-03 DIAGNOSIS — H2513 Age-related nuclear cataract, bilateral: Secondary | ICD-10-CM | POA: Diagnosis not present

## 2024-01-03 DIAGNOSIS — H16223 Keratoconjunctivitis sicca, not specified as Sjogren's, bilateral: Secondary | ICD-10-CM | POA: Diagnosis not present

## 2024-01-03 DIAGNOSIS — E119 Type 2 diabetes mellitus without complications: Secondary | ICD-10-CM | POA: Diagnosis not present

## 2024-01-03 LAB — OPHTHALMOLOGY REPORT-SCANNED

## 2024-01-04 ENCOUNTER — Ambulatory Visit (INDEPENDENT_AMBULATORY_CARE_PROVIDER_SITE_OTHER): Admitting: *Deleted

## 2024-01-04 DIAGNOSIS — E559 Vitamin D deficiency, unspecified: Secondary | ICD-10-CM | POA: Diagnosis not present

## 2024-01-04 MED ORDER — CYANOCOBALAMIN 1000 MCG/ML IJ SOLN
1000.0000 ug | Freq: Once | INTRAMUSCULAR | Status: AC
Start: 2024-01-04 — End: 2024-01-04
  Administered 2024-01-04: 1000 ug via INTRAMUSCULAR

## 2024-01-04 NOTE — Progress Notes (Unsigned)
 Patient presents for B12 injection today. Patient received her B12 injection in Right Deltoid. Patient tolerated injection well.  Documentation entered in [facility] in [facility].

## 2024-01-11 ENCOUNTER — Ambulatory Visit (INDEPENDENT_AMBULATORY_CARE_PROVIDER_SITE_OTHER): Admitting: *Deleted

## 2024-01-11 DIAGNOSIS — E559 Vitamin D deficiency, unspecified: Secondary | ICD-10-CM

## 2024-01-11 DIAGNOSIS — D519 Vitamin B12 deficiency anemia, unspecified: Secondary | ICD-10-CM | POA: Diagnosis not present

## 2024-01-11 MED ORDER — CYANOCOBALAMIN 1000 MCG/ML IJ SOLN
1000.0000 ug | Freq: Once | INTRAMUSCULAR | Status: AC
Start: 2024-01-11 — End: 2024-01-11
  Administered 2024-01-11: 1000 ug via INTRAMUSCULAR

## 2024-01-11 NOTE — Progress Notes (Signed)
 Patient presents for B12 injection today. Patient received her B12 injection in Right Deltoid. Patient tolerated injection well.  Documentation entered in [facility] in [employer].

## 2024-01-18 ENCOUNTER — Ambulatory Visit (INDEPENDENT_AMBULATORY_CARE_PROVIDER_SITE_OTHER)

## 2024-01-18 DIAGNOSIS — E559 Vitamin D deficiency, unspecified: Secondary | ICD-10-CM | POA: Diagnosis not present

## 2024-01-18 MED ORDER — CYANOCOBALAMIN 1000 MCG/ML IJ SOLN
1000.0000 ug | Freq: Once | INTRAMUSCULAR | Status: AC
Start: 2024-01-18 — End: 2024-01-18
  Administered 2024-01-18: 1000 ug via INTRAMUSCULAR

## 2024-01-18 NOTE — Progress Notes (Signed)
 Patient is in office today for a nurse visit for B12 Injection. Patient Injection was given in the  Right deltoid. Patient tolerated injection well. No other questions or concerns were addressed during this visit.

## 2024-01-25 ENCOUNTER — Ambulatory Visit

## 2024-01-25 ENCOUNTER — Ambulatory Visit (INDEPENDENT_AMBULATORY_CARE_PROVIDER_SITE_OTHER)

## 2024-01-25 DIAGNOSIS — E538 Deficiency of other specified B group vitamins: Secondary | ICD-10-CM | POA: Diagnosis not present

## 2024-01-25 MED ORDER — CYANOCOBALAMIN 1000 MCG/ML IJ SOLN
1000.0000 ug | Freq: Once | INTRAMUSCULAR | Status: AC
Start: 2024-01-25 — End: 2024-01-25
  Administered 2024-01-25: 1000 ug via INTRAMUSCULAR

## 2024-01-25 NOTE — Progress Notes (Signed)
.  Patient is in office today for a nurse visit for B12 Injection per Dr. Jimmey Ralph. Patient Injection was given in the  Right deltoid. Patient tolerated injection well.

## 2024-02-08 ENCOUNTER — Telehealth: Payer: Self-pay | Admitting: *Deleted

## 2024-02-08 ENCOUNTER — Ambulatory Visit: Admitting: Family Medicine

## 2024-02-08 ENCOUNTER — Encounter: Payer: Self-pay | Admitting: Family Medicine

## 2024-02-08 VITALS — BP 126/72 | HR 83 | Temp 97.2°F | Resp 14 | Ht 69.0 in | Wt 197.2 lb

## 2024-02-08 DIAGNOSIS — E1165 Type 2 diabetes mellitus with hyperglycemia: Secondary | ICD-10-CM

## 2024-02-08 DIAGNOSIS — E559 Vitamin D deficiency, unspecified: Secondary | ICD-10-CM

## 2024-02-08 DIAGNOSIS — E538 Deficiency of other specified B group vitamins: Secondary | ICD-10-CM | POA: Insufficient documentation

## 2024-02-08 MED ORDER — CONTINUOUS GLUCOSE SENSOR MISC: 1.0000 | 5 refills | Status: AC

## 2024-02-08 MED ORDER — FREESTYLE LIBRE 14 DAY READER DEVI: 0 refills | Status: AC

## 2024-02-08 NOTE — Telephone Encounter (Signed)
 Copied from CRM #[id number]. Topic: Clinical - Prescription Issue >> Feb 08, 2024 10:31 AM Burnard DEL wrote: Reason for CRM: Pharmacy called in ,in regards to prescription for Continuous Glucose Sensor MISC ,however prescription doesn't state which monitor patient needs.

## 2024-02-08 NOTE — Assessment & Plan Note (Signed)
 She is now on vitamin D  supplementation.  Recheck again in a couple of months.

## 2024-02-08 NOTE — Assessment & Plan Note (Signed)
 Patient has noticed improvement in overall energy levels and muscle aches since starting B12 supplementation.  Still having some mild peripheral neuropathy though hopefully this will continue to improve over the next couple of months she will come back in couple months to recheck labs.

## 2024-02-08 NOTE — Progress Notes (Signed)
   Shelly Yoder is a 78 y.o. female who presents today for an office visit.  Assessment/Plan:    Chronic Problems Addressed Today: B12 deficiency Patient has noticed improvement in overall energy levels and muscle aches since starting B12 supplementation.  Still having some mild peripheral neuropathy though hopefully this will continue to improve over the next couple of months she will come back in couple months to recheck labs.  Vitamin D  deficiency She is now on vitamin D  supplementation.  Recheck again in a couple of months.  Type 2 diabetes mellitus with hyperglycemia, without long-term current use of insulin (HCC) Last A1c well-controlled 6.3 via dietary modifications.  Will send a prescription in for continuous glucometer.  Will recheck A1c at her next follow-up visit in the spring.     Subjective:  HPI:  See assessment / plan for status of chronic conditions.   Discussed the use of AI scribe software for clinical note transcription with the patient, who gave verbal consent to proceed.  History of Present Illness Shelly Yoder is a 78 year old female with neuropathy who presents for follow-up on her condition and management of diabetes.  She feels better overall since starting a new treatment regimen about a month ago, which includes vitamin D  supplementation. There is improvement in her leg symptoms, describing them as less 'achy'. However, she continues to experience neuropathy in her feet, which remains despite some improvement.  She is currently taking vitamin D  and has started B12 supplementation for her neuropathy. She is proactive about managing her condition, influenced by her mother's experience, and is reviewing her insurance benefits to ensure she is utilizing them fully.  She is considering obtaining a continuous glucose monitor to better manage her diabetes. She has been in contact with her insurance to process the acquisition of a glucometer, specifically interested  in the continuous glucose monitoring systems like Dex[employer] or [employer], depending on what her insurance covers.  She is contemplating switching her insurance plan to one that better supports chronic conditions like diabetes, which includes a food allowance and more dental coverage. She is currently with [employer] but is considering a plan with [employer], which she believes may offer better benefits for her needs.         Objective:  Physical Exam: BP 126/72   Pulse 83   Temp (!) 97.2 F (36.2 C) (Temporal)   Resp 14   Ht 5' 9 (1.753 m)   Wt 197 lb 3.2 oz (89.4 kg)   LMP  (LMP Unknown)   SpO2 99%   BMI 29.12 kg/m   Gen: No acute distress, resting comfortably CV: Regular rate and rhythm with no murmurs appreciated Pulm: Normal work of breathing, clear to auscultation bilaterally with no crackles, wheezes, or rhonchi Neuro: Grossly normal, moves all extremities Psych: Normal affect and thought content      Bethaney Oshana M. Kennyth, MD 02/08/2024 10:05 AM

## 2024-02-08 NOTE — Assessment & Plan Note (Signed)
 Last A1c well-controlled 6.3 via dietary modifications.  Will send a prescription in for continuous glucometer.  Will recheck A1c at her next follow-up visit in the spring.

## 2024-02-08 NOTE — Patient Instructions (Signed)
 It was very nice to see you today!  VISIT SUMMARY: Today, we discussed your ongoing management of diabetes and neuropathy. You reported feeling better overall with improvements in your leg symptoms since starting vitamin D  and B12 supplementation. We also talked about obtaining a continuous glucose monitor to help manage your diabetes more effectively.  YOUR PLAN: TYPE 2 DIABETES MELLITUS WITH PERIPHERAL NEUROPATHY: Your neuropathy symptoms have improved, and you are interested in a continuous glucose monitor for better management. -We have prescribed a continuous glucose monitor for you. Please follow up with your insurance to process this. -Continue taking your B12 supplements and we will recheck your levels in a couple of months. -If your neuropathy symptoms persist, we may refer you to a podiatrist or neurologist.  VITAMIN B12 DEFICIENCY: Your symptoms have improved with B12 supplementation. -Continue taking your B12 supplements. We expect full effectiveness in 2-3 months and will recheck your B12 levels in a couple of months.  VITAMIN D  DEFICIENCY: Your symptoms have improved with vitamin D  supplementation. -Continue taking your vitamin D  supplements and we will recheck your vitamin D  levels in a couple of months.  Return if symptoms worsen or fail to improve.   Take care, Dr Kennyth  PLEASE NOTE:  If you had any lab tests, please let us  know if you have not heard back within a few days. You may see your results on mychart before we have a chance to review them but we will give you a call once they are reviewed by us .   If we ordered any referrals today, please let us  know if you have not heard from their office within the next week.   If you had any urgent prescriptions sent in today, please check with the pharmacy within an hour of our visit to make sure the prescription was transmitted appropriately.   Please try these tips to maintain a healthy lifestyle:  Eat at least 3 REAL  meals and 1-2 snacks per day.  Aim for no more than 5 hours between eating.  If you eat breakfast, please do so within one hour of getting up.   Each meal should contain half fruits/vegetables, one quarter protein, and one quarter carbs (no bigger than a computer mouse)  Cut down on sweet beverages. This includes juice, soda, and sweet tea.   Drink at least 1 glass of water with each meal and aim for at least 8 glasses per day  Exercise at least 150 minutes every week.

## 2024-02-23 ENCOUNTER — Ambulatory Visit

## 2024-02-23 DIAGNOSIS — E538 Deficiency of other specified B group vitamins: Secondary | ICD-10-CM | POA: Diagnosis not present

## 2024-02-23 MED ORDER — CYANOCOBALAMIN 1000 MCG/ML IJ SOLN
1000.0000 ug | Freq: Once | INTRAMUSCULAR | Status: AC
  Administered 2024-02-23: 10:00:00 1000 ug via INTRAMUSCULAR

## 2024-02-23 NOTE — Progress Notes (Signed)
 Patient is in office today for a nurse visit for B12 Injection. Patient Injection was given in the  Right deltoid. Patient tolerated injection well.

## 2024-03-27 ENCOUNTER — Ambulatory Visit

## 2024-03-27 DIAGNOSIS — E538 Deficiency of other specified B group vitamins: Secondary | ICD-10-CM

## 2024-03-27 MED ORDER — CYANOCOBALAMIN 1000 MCG/ML IJ SOLN
1000.0000 ug | Freq: Once | INTRAMUSCULAR | Status: AC
  Administered 2024-03-27: 1000 ug via INTRAMUSCULAR

## 2024-03-27 NOTE — Progress Notes (Signed)
 Patient is in office today for a nurse visit for B12 Injection. Patient Injection was given in the  Right deltoid. Patient tolerated injection well. No other questions or concerns were addressed during her visit.

## 2024-04-10 ENCOUNTER — Telehealth: Payer: Self-pay | Admitting: *Deleted

## 2024-04-10 NOTE — Telephone Encounter (Signed)
 Copied from CRM #[id number]. Topic: Clinical - Medication Question >> Apr 09, 2024  9:51 AM Darshell M wrote: Reason for CRM: Patient is changing insurance. [facility] is requesting a phone call with or fax from Dr. Kennyth to confirm patient has a chronic condition, is diabetic, and has peripheral neuropathy. [phone] phone [phone] fax   Unable to contact caller  No extension number provider  [facility]

## 2024-05-01 ENCOUNTER — Ambulatory Visit

## 2024-06-12 ENCOUNTER — Encounter: Admitting: Family Medicine

## 2024-07-10 ENCOUNTER — Ambulatory Visit
